# Patient Record
Sex: Female | Born: 1990 | Race: Black or African American | Hispanic: No | Marital: Single | Smoking: Former smoker
Health system: Southern US, Community
[De-identification: ages and names within clinical notes are randomized; demographics above are authoritative.]

## PROBLEM LIST (undated history)

## (undated) ENCOUNTER — Ambulatory Visit (HOSPITAL_COMMUNITY): Payer: Medicaid Other

## (undated) DIAGNOSIS — F32A Depression, unspecified: Secondary | ICD-10-CM

## (undated) DIAGNOSIS — T7840XA Allergy, unspecified, initial encounter: Secondary | ICD-10-CM

## (undated) DIAGNOSIS — N390 Urinary tract infection, site not specified: Secondary | ICD-10-CM

## (undated) DIAGNOSIS — A749 Chlamydial infection, unspecified: Secondary | ICD-10-CM

## (undated) DIAGNOSIS — J45909 Unspecified asthma, uncomplicated: Secondary | ICD-10-CM

## (undated) HISTORY — PX: WISDOM TOOTH EXTRACTION: SHX21

## (undated) HISTORY — DX: Unspecified asthma, uncomplicated: J45.909

## (undated) HISTORY — DX: Allergy, unspecified, initial encounter: T78.40XA

## (undated) HISTORY — DX: Depression, unspecified: F32.A

---
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
## 2011-01-27 ENCOUNTER — Encounter (HOSPITAL_COMMUNITY): Payer: Self-pay

## 2011-01-27 ENCOUNTER — Emergency Department (HOSPITAL_COMMUNITY): Payer: Medicaid Other

## 2011-01-27 ENCOUNTER — Emergency Department (HOSPITAL_COMMUNITY)
Admission: EM | Admit: 2011-01-27 | Discharge: 2011-01-27 | Disposition: A | Discharge status: Home / Self Care | Payer: Medicaid Other | Attending: Emergency Medicine | Admitting: Emergency Medicine

## 2011-01-27 DIAGNOSIS — O99891 Other specified diseases and conditions complicating pregnancy: Secondary | ICD-10-CM | POA: Insufficient documentation

## 2011-01-27 DIAGNOSIS — J069 Acute upper respiratory infection, unspecified: Secondary | ICD-10-CM | POA: Insufficient documentation

## 2011-01-27 DIAGNOSIS — R509 Fever, unspecified: Secondary | ICD-10-CM | POA: Insufficient documentation

## 2011-01-27 DIAGNOSIS — R0682 Tachypnea, not elsewhere classified: Secondary | ICD-10-CM | POA: Insufficient documentation

## 2011-01-27 DIAGNOSIS — R000 Tachycardia, unspecified: Secondary | ICD-10-CM | POA: Insufficient documentation

## 2011-01-27 DIAGNOSIS — R079 Chest pain, unspecified: Secondary | ICD-10-CM | POA: Insufficient documentation

## 2011-01-27 DIAGNOSIS — R0602 Shortness of breath: Secondary | ICD-10-CM | POA: Insufficient documentation

## 2011-01-27 LAB — POCT I-STAT, CHEM 8
Glucose, Bld: 90 mg/dL (ref 70–99)
HCT: 35 % — ABNORMAL LOW (ref 36.0–46.0)
Hemoglobin: 11.9 g/dL — ABNORMAL LOW (ref 12.0–15.0)
Potassium: 3.2 mEq/L — ABNORMAL LOW (ref 3.5–5.1)
Sodium: 138 mEq/L (ref 135–145)
TCO2: 20 mmol/L (ref 0–100)

## 2011-01-27 LAB — D-DIMER, QUANTITATIVE: D-Dimer, Quant: 0.96 ug/mL-FEU — ABNORMAL HIGH (ref 0.00–0.48)

## 2011-01-27 LAB — URINALYSIS, ROUTINE W REFLEX MICROSCOPIC
Glucose, UA: NEGATIVE mg/dL
Ketones, ur: NEGATIVE mg/dL
Nitrite: NEGATIVE
Protein, ur: NEGATIVE mg/dL
Urobilinogen, UA: 1 mg/dL (ref 0.0–1.0)

## 2011-01-27 LAB — CBC
HCT: 34 % — ABNORMAL LOW (ref 36.0–46.0)
MCHC: 35 g/dL (ref 30.0–36.0)
Platelets: 213 10*3/uL (ref 150–400)
RDW: 12.9 % (ref 11.5–15.5)
WBC: 8.6 10*3/uL (ref 4.0–10.5)

## 2011-01-27 LAB — DIFFERENTIAL
Basophils Absolute: 0 10*3/uL (ref 0.0–0.1)
Basophils Relative: 0 % (ref 0–1)
Eosinophils Relative: 2 % (ref 0–5)
Lymphocytes Relative: 13 % (ref 12–46)

## 2011-01-27 LAB — URINE MICROSCOPIC-ADD ON

## 2011-01-27 MED ORDER — IOHEXOL 300 MG/ML  SOLN
125.0000 mL | Freq: Once | INTRAMUSCULAR | Status: AC | PRN
  Administered 2011-01-27: 103 mL via INTRAVENOUS

## 2011-01-27 NOTE — Progress Notes (Signed)
Call received from [facility] at 1545.  Arrived to Room 20 at [facility] at 1600.  Pt resting comfortably in bed.  G1P0.  [facility] 04/10/11 per [place] dating.  GA 29+4.  Pt received PNC from Dr Gaynell Face.  Next OBGYN appt for 1345 on 01/29/11.  Med hx: no significant med hx.  Ob hx:  Pt reports normal US anatomy scan at 24 weeks.  No issues with current pregnancy.  Surgical hx: negative.  NKDA.  NKFA.  No latex allergies.  Meds;  Flintstones multi vit 1 PO daily.  Last dose 01/27/11.    Pt presents with c/o SOB and coughing.  Pt denies any OB complaints.  Pt states no abdominal cramping or tightening.  Pt denies LOF or vaginal bleeding.  Pt states normal fetal movement.  Pt received an albuterol tx prior to [facility] arrival to room.  Pt states albuterol tx relieved her SOB.  VSS.  O2 sat 99% on RA.  Pt's abdomen soft and nontender upon palpation with no evidence of UCs.  No LOF or vaginal bldg noted.  Copious fetal movement noted that is palpable, visible, and audible on EFM.  EFM/toco applied and assessing.

## 2011-01-27 NOTE — Progress Notes (Signed)
Plan of care discussed with pt at this time.  Pt's questions answered in full and to her verbalized understanding.  Pt verbalizes understanding of and agreement with her care plan. Pt instructed to keep her next regularly scheduled OB appointment with Dr Gaynell Face on 01/29/11.  PTL precautions and kick counts reviewed with pt at this time.  Pt encouraged to go to [facility] to be evaluated should she experience any s/s of PTL, abdominal cramping, uterine contractions, vaginal bleeding, any leaking of fluid, or decreased fetal movement.  Pt verbalizes understanding of and agreement with both her dc instructions and her plan of care.

## 2011-01-27 NOTE — Progress Notes (Signed)
Dr Lottie Rater re pt presence, status, OB/med hx, VS, FHTs, UCs, Albuterol admin, OBGYN appointment 01/29/11.  Pt cleared from OB standpoint by dr Gaynell Face at this time.

## 2011-02-22 ENCOUNTER — Inpatient Hospital Stay (HOSPITAL_COMMUNITY)
Admission: AD | Admit: 2011-02-22 | Discharge: 2011-02-23 | Disposition: A | Discharge status: Home / Self Care | Payer: Medicaid Other | Source: Ambulatory Visit | Attending: Obstetrics | Admitting: Obstetrics

## 2011-02-22 DIAGNOSIS — N760 Acute vaginitis: Secondary | ICD-10-CM

## 2011-02-22 DIAGNOSIS — B9689 Other specified bacterial agents as the cause of diseases classified elsewhere: Secondary | ICD-10-CM | POA: Insufficient documentation

## 2011-02-22 DIAGNOSIS — O2390 Unspecified genitourinary tract infection in pregnancy, unspecified trimester: Secondary | ICD-10-CM | POA: Insufficient documentation

## 2011-02-22 DIAGNOSIS — A499 Bacterial infection, unspecified: Secondary | ICD-10-CM

## 2011-02-22 DIAGNOSIS — O209 Hemorrhage in early pregnancy, unspecified: Secondary | ICD-10-CM | POA: Insufficient documentation

## 2011-02-22 DIAGNOSIS — O4690 Antepartum hemorrhage, unspecified, unspecified trimester: Secondary | ICD-10-CM

## 2011-02-22 DIAGNOSIS — Z3480 Encounter for supervision of other normal pregnancy, unspecified trimester: Secondary | ICD-10-CM

## 2011-02-22 HISTORY — DX: Urinary tract infection, site not specified: N39.0

## 2011-02-23 ENCOUNTER — Inpatient Hospital Stay (HOSPITAL_COMMUNITY): Payer: Medicaid Other

## 2011-02-23 ENCOUNTER — Encounter (HOSPITAL_COMMUNITY): Payer: Self-pay

## 2011-02-23 LAB — URINE MICROSCOPIC-ADD ON

## 2011-02-23 LAB — URINALYSIS, ROUTINE W REFLEX MICROSCOPIC
Bilirubin Urine: NEGATIVE
Glucose, UA: NEGATIVE mg/dL
Hgb urine dipstick: NEGATIVE
Ketones, ur: NEGATIVE mg/dL
Protein, ur: NEGATIVE mg/dL
pH: 6 (ref 5.0–8.0)

## 2011-02-23 LAB — WET PREP, GENITAL

## 2011-02-23 MED ORDER — METRONIDAZOLE 500 MG PO TABS: 500.0000 mg | ORAL_TABLET | Freq: Two times a day (BID) | ORAL | Status: DC

## 2011-02-23 MED ORDER — METRONIDAZOLE 500 MG PO TABS: 500.0000 mg | ORAL_TABLET | Freq: Two times a day (BID) | ORAL | Status: AC

## 2011-02-23 NOTE — ED Notes (Signed)
Roney Marion CNM in with pt

## 2011-02-23 NOTE — ED Provider Notes (Signed)
History     Chief Complaint  Patient presents with  . Vaginal Bleeding   HPI Patient is here with c/o bright red vaginal bleeding about 2340pm. She states that she was at work and noticed the bleeding after voiding with wiping.  No active bleeding at this time.  Also reports recent intercourse.  Good fetal movement and denies any pelvic pain.    Past Medical History  Diagnosis Date  . UTI (lower urinary tract infection)     History reviewed. No pertinent past surgical history.  Family History  Problem Relation Age of Onset  . Diabetes Maternal Grandmother     History  Substance Use Topics  . Smoking status: Never Smoker   . Smokeless tobacco: Not on file  . Alcohol Use: No    Allergies: No Known Allergies  Prescriptions prior to admission  Medication Sig Dispense Refill  . albuterol (VENTOLIN HFA) 108 (90 BASE) MCG/ACT inhaler Inhale 2 puffs into the lungs every 6 (six) hours as needed. Shortness of breath       . amoxicillin (AMOXIL) 500 MG capsule Take 500 mg by mouth 3 (three) times daily.        . flintstones complete (FLINTSTONES) 60 MG chewable tablet Chew 1 tablet by mouth daily.          Review of Systems  Genitourinary:       Vaginal bleeding  All other systems reviewed and are negative.   Physical Exam   Blood pressure 131/59, pulse 85, temperature 97 F (36.1 C), temperature source Oral, resp. rate 20, height 5\' 2"  (1.575 m), weight 69.31 kg (152 lb 12.8 oz).  Physical Exam  Constitutional: She is oriented to person, place, and time. She appears well-developed and well-nourished.  HENT:  Head: Normocephalic.  Neck: Normal range of motion. Neck supple.  Cardiovascular: Normal rate, regular rhythm and normal heart sounds.  Exam reveals no gallop and no friction rub.   No murmur heard. Respiratory: Effort normal and breath sounds normal. No respiratory distress.  GI: Soft. She exhibits no mass. There is no tenderness. There is no rebound and no  guarding.  Genitourinary: No bleeding around the vagina. Vaginal discharge (white dc) found.  Neurological: She is alert and oriented to person, place, and time.  Skin: Skin is warm and dry.  FHR 150's, +accel, no decel; Toco - irritability (not felt by pt)  MAU Course  Procedures UA - small leuk Wet prep - moderate clue, neg trich, neg yeast [place] - no evidence of abruption or previa; BPP 6/8 (2 off for breathing)  Assessment and Plan  Bacterial Vaginosis Bleeding During Pregnancy  Plan: RX Flagyl F/U with Dr. Gaynell Face this week. Preterm labor precautions Urine culture [facility] 02/23/2011, 1:03 AM

## 2011-02-23 NOTE — Progress Notes (Signed)
Patient is here with c/o bright red vaginal bleeding about 2340pm. She states that she was at work and noticed the bleeding after voiding. She reports good fetal movement and denies any regular pain.

## 2011-02-23 NOTE — Progress Notes (Signed)
G1 at 33.3wks. Pt didn't feel FM for about past . Went to [facility] and saw bright blood on tissue. Was at work at [employer]. Came straight to hospital.

## 2011-02-24 LAB — URINE CULTURE: Culture  Setup Time: 201210281124

## 2011-03-06 LAB — STREP B DNA PROBE: GBS: NEGATIVE

## 2011-04-10 ENCOUNTER — Inpatient Hospital Stay (HOSPITAL_COMMUNITY): Payer: Medicaid Other | Admitting: Anesthesiology

## 2011-04-10 ENCOUNTER — Encounter (HOSPITAL_COMMUNITY): Payer: Self-pay | Admitting: *Deleted

## 2011-04-10 ENCOUNTER — Encounter (HOSPITAL_COMMUNITY): Payer: Self-pay | Admitting: Anesthesiology

## 2011-04-10 ENCOUNTER — Inpatient Hospital Stay (HOSPITAL_COMMUNITY)
Admission: AD | Admit: 2011-04-10 | Discharge: 2011-04-13 | DRG: 775 | Disposition: A | Discharge status: Home / Self Care | Payer: Medicaid Other | Source: Ambulatory Visit | Attending: Obstetrics | Admitting: Obstetrics

## 2011-04-10 LAB — RPR: RPR Ser Ql: NONREACTIVE

## 2011-04-10 LAB — CBC
Hemoglobin: 12.3 g/dL (ref 12.0–15.0)
Platelets: 235 10*3/uL (ref 150–400)
RBC: 3.99 MIL/uL (ref 3.87–5.11)
WBC: 7.4 10*3/uL (ref 4.0–10.5)

## 2011-04-10 MED ORDER — FENTANYL 2.5 MCG/ML BUPIVACAINE 1/10 % EPIDURAL INFUSION (WH - ANES)
14.0000 mL/h | INTRAMUSCULAR | Status: DC
  Administered 2011-04-10 – 2011-04-11 (×2): 14 mL/h via EPIDURAL
  Filled 2011-04-10 (×2): qty 60

## 2011-04-10 MED ORDER — EPHEDRINE 5 MG/ML INJ: 10.0000 mg | INTRAVENOUS | Status: DC | PRN

## 2011-04-10 MED ORDER — ACETAMINOPHEN 325 MG PO TABS: 650.0000 mg | ORAL_TABLET | ORAL | Status: DC | PRN

## 2011-04-10 MED ORDER — DIPHENHYDRAMINE HCL 50 MG/ML IJ SOLN: 12.5000 mg | INTRAMUSCULAR | Status: DC | PRN

## 2011-04-10 MED ORDER — OXYTOCIN 20 UNITS IN LACTATED RINGERS INFUSION - SIMPLE
1.0000 m[IU]/min | INTRAVENOUS | Status: DC
Start: 2011-04-10 — End: 2011-04-11
  Administered 2011-04-11: 1 m[IU]/min via INTRAVENOUS
  Filled 2011-04-10: qty 1000

## 2011-04-10 MED ORDER — OXYTOCIN 20 UNITS IN LACTATED RINGERS INFUSION - SIMPLE
125.0000 mL/h | Freq: Once | INTRAVENOUS | Status: AC
  Administered 2011-04-11: 999 mL/h via INTRAVENOUS

## 2011-04-10 MED ORDER — TERBUTALINE SULFATE 1 MG/ML IJ SOLN: 0.2500 mg | Freq: Once | INTRAMUSCULAR | Status: AC | PRN

## 2011-04-10 MED ORDER — LIDOCAINE HCL (PF) 1 % IJ SOLN
30.0000 mL | INTRAMUSCULAR | Status: DC | PRN
  Filled 2011-04-10: qty 30

## 2011-04-10 MED ORDER — FLEET ENEMA 7-19 GM/118ML RE ENEM: 1.0000 | ENEMA | RECTAL | Status: DC | PRN

## 2011-04-10 MED ORDER — LIDOCAINE HCL 1.5 % IJ SOLN
INTRAMUSCULAR | Status: DC | PRN
  Administered 2011-04-10 (×2): 5 mL via EPIDURAL

## 2011-04-10 MED ORDER — OXYTOCIN BOLUS FROM INFUSION
500.0000 mL | Freq: Once | INTRAVENOUS | Status: DC
  Filled 2011-04-10: qty 500

## 2011-04-10 MED ORDER — OXYCODONE-ACETAMINOPHEN 5-325 MG PO TABS: 2.0000 | ORAL_TABLET | ORAL | Status: DC | PRN

## 2011-04-10 MED ORDER — PHENYLEPHRINE 40 MCG/ML (10ML) SYRINGE FOR IV PUSH (FOR BLOOD PRESSURE SUPPORT): 80.0000 ug | PREFILLED_SYRINGE | INTRAVENOUS | Status: DC | PRN

## 2011-04-10 MED ORDER — BUTORPHANOL TARTRATE 2 MG/ML IJ SOLN
1.0000 mg | INTRAMUSCULAR | Status: DC | PRN
  Administered 2011-04-10: 1 mg via INTRAVENOUS
  Filled 2011-04-10 (×2): qty 1

## 2011-04-10 MED ORDER — LACTATED RINGERS IV SOLN
500.0000 mL | INTRAVENOUS | Status: DC | PRN
  Administered 2011-04-11: 500 mL via INTRAVENOUS

## 2011-04-10 MED ORDER — LACTATED RINGERS IV SOLN
500.0000 mL | Freq: Once | INTRAVENOUS | Status: AC
  Administered 2011-04-10: 500 mL via INTRAVENOUS

## 2011-04-10 MED ORDER — IBUPROFEN 600 MG PO TABS
600.0000 mg | ORAL_TABLET | Freq: Four times a day (QID) | ORAL | Status: DC | PRN
  Administered 2011-04-11: 600 mg via ORAL

## 2011-04-10 MED ORDER — CITRIC ACID-SODIUM CITRATE 334-500 MG/5ML PO SOLN: 30.0000 mL | ORAL | Status: DC | PRN

## 2011-04-10 MED ORDER — LACTATED RINGERS IV SOLN
INTRAVENOUS | Status: DC
  Administered 2011-04-10 – 2011-04-11 (×3): via INTRAVENOUS
  Administered 2011-04-11: 116 mL/h via INTRAVENOUS

## 2011-04-10 MED ORDER — PROMETHAZINE HCL 25 MG/ML IJ SOLN: 12.5000 mg | INTRAMUSCULAR | Status: DC | PRN

## 2011-04-10 MED ORDER — ONDANSETRON HCL 4 MG/2ML IJ SOLN: 4.0000 mg | Freq: Four times a day (QID) | INTRAMUSCULAR | Status: DC | PRN

## 2011-04-10 MED ORDER — EPHEDRINE 5 MG/ML INJ
10.0000 mg | INTRAVENOUS | Status: DC | PRN
  Filled 2011-04-10: qty 4

## 2011-04-10 MED ORDER — PHENYLEPHRINE 40 MCG/ML (10ML) SYRINGE FOR IV PUSH (FOR BLOOD PRESSURE SUPPORT)
80.0000 ug | PREFILLED_SYRINGE | INTRAVENOUS | Status: DC | PRN
  Filled 2011-04-10: qty 5

## 2011-04-10 NOTE — Anesthesia Preprocedure Evaluation (Signed)

## 2011-04-10 NOTE — Plan of Care (Signed)
Problem: Consults Goal: Orientation to unit: Other (Specify with a note) Outcome: Completed/Met Date Met:  04/10/11 Instructed pt and visitors on visitation policy of  unit

## 2011-04-10 NOTE — Progress Notes (Signed)
Contractions started last night.  Denies bleeding, has mucous d/c

## 2011-04-10 NOTE — Plan of Care (Signed)
Problem: Consults Goal: Birthing Suites Patient Information Press F2 to bring up selections list   Pt 37-[redacted] weeks EGA     

## 2011-04-10 NOTE — Anesthesia Procedure Notes (Signed)
Epidural Patient location during procedure: OB Start time: 04/10/2011 10:37 PM  Staffing Anesthesiologist: Brayton Caves R Performed by: anesthesiologist   Preanesthetic Checklist Completed: patient identified, site marked, surgical consent, pre-op evaluation, timeout performed, IV checked, risks and benefits discussed and monitors and equipment checked  Epidural Patient position: sitting Prep: site prepped and draped and DuraPrep Patient monitoring: continuous pulse ox and blood pressure Approach: midline Injection technique: LOR air and LOR saline  Needle:  Needle type: Tuohy  Needle gauge: 17 G Needle length: 9 cm Needle insertion depth: 5 cm cm Catheter type: closed end flexible Catheter size: 19 Gauge Catheter at skin depth: 10 cm Test dose: negative  Assessment Events: blood not aspirated, injection not painful, no injection resistance, negative IV test and no paresthesia  Additional Notes Patient identified.  Risk benefits discussed including failed block, incomplete pain control, headache, nerve damage, paralysis, blood pressure changes, nausea, vomiting, reactions to medication both toxic or allergic, and postpartum back pain.  Patient expressed understanding and wished to proceed.  All questions were answered.  Sterile technique used throughout procedure and epidural site dressed with sterile barrier dressing. No paresthesia or other complications noted.The patient did not experience any signs of intravascular injection such as tinnitus or metallic taste in mouth nor signs of intrathecal spread such as rapid motor block. Please see nursing notes for vital signs.

## 2011-04-10 NOTE — H&P (Signed)
This is Dr. Francoise Ceo dictating the history and physical on  Diamond Rodriguez she's a 20 year old primigravida the negative GBS for 2 weeks today to 04/10/2011 she desired induction she is on Pitocin and her cervix is 5 cm 100% with the vertex at a -3 station membranes were ruptured artificially and the fluid was slightly meconium-stained past medical history  Past medical history negative Past surgical history negative Social history Negative System review negative Physical exam Well-developed female in no distress HEENT negative Breasts negative Lungs clear Heart regular rhythm no murmurs no gallops Abdomen term Extremities negative and

## 2011-04-11 ENCOUNTER — Encounter (HOSPITAL_COMMUNITY): Payer: Self-pay | Admitting: Pediatric Intensive Care

## 2011-04-11 MED ORDER — DIBUCAINE 1 % RE OINT: 1.0000 "application " | TOPICAL_OINTMENT | RECTAL | Status: DC | PRN

## 2011-04-11 MED ORDER — BENZOCAINE-MENTHOL 20-0.5 % EX AERO
INHALATION_SPRAY | CUTANEOUS | Status: AC
  Filled 2011-04-11: qty 56

## 2011-04-11 MED ORDER — ONDANSETRON HCL 4 MG/2ML IJ SOLN: 4.0000 mg | INTRAMUSCULAR | Status: DC | PRN

## 2011-04-11 MED ORDER — SENNOSIDES-DOCUSATE SODIUM 8.6-50 MG PO TABS
2.0000 | ORAL_TABLET | Freq: Every day | ORAL | Status: DC
  Administered 2011-04-11: 2 via ORAL

## 2011-04-11 MED ORDER — ONDANSETRON HCL 4 MG PO TABS: 4.0000 mg | ORAL_TABLET | ORAL | Status: DC | PRN

## 2011-04-11 MED ORDER — TETANUS-DIPHTH-ACELL PERTUSSIS 5-2.5-18.5 LF-MCG/0.5 IM SUSP: 0.5000 mL | Freq: Once | INTRAMUSCULAR | Status: DC

## 2011-04-11 MED ORDER — OXYCODONE-ACETAMINOPHEN 5-325 MG PO TABS: 1.0000 | ORAL_TABLET | ORAL | Status: DC | PRN

## 2011-04-11 MED ORDER — LANOLIN HYDROUS EX OINT: TOPICAL_OINTMENT | CUTANEOUS | Status: DC | PRN

## 2011-04-11 MED ORDER — IBUPROFEN 600 MG PO TABS
600.0000 mg | ORAL_TABLET | Freq: Four times a day (QID) | ORAL | Status: DC
  Administered 2011-04-11 – 2011-04-13 (×8): 600 mg via ORAL
  Filled 2011-04-11 (×9): qty 1

## 2011-04-11 MED ORDER — FERROUS SULFATE 325 (65 FE) MG PO TABS
325.0000 mg | ORAL_TABLET | Freq: Two times a day (BID) | ORAL | Status: DC
  Administered 2011-04-11 – 2011-04-13 (×5): 325 mg via ORAL
  Filled 2011-04-11 (×5): qty 1

## 2011-04-11 MED ORDER — SIMETHICONE 80 MG PO CHEW: 80.0000 mg | CHEWABLE_TABLET | ORAL | Status: DC | PRN

## 2011-04-11 MED ORDER — WITCH HAZEL-GLYCERIN EX PADS: 1.0000 "application " | MEDICATED_PAD | CUTANEOUS | Status: DC | PRN

## 2011-04-11 MED ORDER — PRENATAL PLUS 27-1 MG PO TABS
1.0000 | ORAL_TABLET | Freq: Every day | ORAL | Status: DC
  Administered 2011-04-11 – 2011-04-13 (×3): 1 via ORAL
  Filled 2011-04-11 (×3): qty 1

## 2011-04-11 MED ORDER — BENZOCAINE-MENTHOL 20-0.5 % EX AERO
1.0000 "application " | INHALATION_SPRAY | CUTANEOUS | Status: DC | PRN
  Administered 2011-04-11 – 2011-04-12 (×2): 1 via TOPICAL

## 2011-04-11 MED ORDER — DIPHENHYDRAMINE HCL 25 MG PO CAPS: 25.0000 mg | ORAL_CAPSULE | Freq: Four times a day (QID) | ORAL | Status: DC | PRN

## 2011-04-11 MED ORDER — ZOLPIDEM TARTRATE 5 MG PO TABS: 5.0000 mg | ORAL_TABLET | Freq: Every evening | ORAL | Status: DC | PRN

## 2011-04-11 NOTE — Progress Notes (Signed)
UR chart review completed.  

## 2011-04-11 NOTE — Anesthesia Postprocedure Evaluation (Signed)
  Anesthesia Post Note  Patient: Diamond Rodriguez  Procedure(s) Performed: * No procedures listed *  Anesthesia type: Epidural  Patient location: Mother/Baby  Post pain: Pain level controlled  Post assessment: Post-op Vital signs reviewed  Last Vitals:  Filed Vitals:   04/11/11 0645  BP: 115/68  Pulse: 79  Temp: 36.8 C  Resp: 18    Post vital signs: Reviewed  Level of consciousness:alert  Complications: No apparent anesthesia complications

## 2011-04-11 NOTE — Progress Notes (Signed)
Pt delivered viable female with APGARS 9,9 SVD. Dr Gaynell Face present.

## 2011-04-11 NOTE — Progress Notes (Signed)
MD made aware of pt status: SVE, uterine contraction pattern, FHT tracing, interventions and Pitocin settings. Continue with current POC. Will continue to monitor.

## 2011-04-12 LAB — CBC
MCH: 30.2 pg (ref 26.0–34.0)
MCHC: 34 g/dL (ref 30.0–36.0)
Platelets: 214 10*3/uL (ref 150–400)
RDW: 13.4 % (ref 11.5–15.5)

## 2011-04-12 MED ORDER — BENZOCAINE-MENTHOL 20-0.5 % EX AERO
INHALATION_SPRAY | CUTANEOUS | Status: AC
  Administered 2011-04-12: 1 via TOPICAL
  Filled 2011-04-12: qty 56

## 2011-04-12 NOTE — Progress Notes (Signed)
Patient ID: Diamond Rodriguez, female   DOB: June 25, 1990, 20 y.o.   MRN: [id number] Post Partum Day 1 S/P spontaneous vaginal RH status/Rubella reviewed.  Feeding: breast Subjective: No HA, SOB, CP, F/C, breast symptoms. Normal vaginal bleeding, no clots.     Objective: BP 103/72  Pulse 87  Temp(Src) 97.8 F (36.6 C) (Oral)  Resp 18  Ht 5\' 2"  (1.575 m)  Wt 72.485 kg (159 lb 12.8 oz)  BMI 29.23 kg/m2  SpO2 100%  Breastfeeding? Unknown   Physical Exam:  General: alert Lochia: appropriate Uterine Fundus: firm DVT Evaluation: No evidence of DVT seen on physical exam. Ext: No c/c/e  Basename 04/12/11 0835 04/10/11 1250  HGB 11.1* 12.3  HCT 32.6* 35.4*      Assessment/Plan: 20 y.o.  PPD #1 .  normal postpartum exam Continue current postpartum care  Ambulate   LOS: 2 days   [name],Melisssa Donner A 04/12/2011, 11:13 AM

## 2011-04-13 MED ORDER — IBUPROFEN 600 MG PO TABS: 600.0000 mg | ORAL_TABLET | Freq: Four times a day (QID) | ORAL | Status: AC

## 2011-04-13 MED ORDER — ALBUTEROL SULFATE HFA 108 (90 BASE) MCG/ACT IN AERS: 2.0000 | INHALATION_SPRAY | Freq: Four times a day (QID) | RESPIRATORY_TRACT | Status: DC | PRN

## 2011-04-13 MED ORDER — OXYCODONE-ACETAMINOPHEN 5-325 MG PO TABS: 1.0000 | ORAL_TABLET | Freq: Four times a day (QID) | ORAL | Status: AC | PRN

## 2011-04-13 NOTE — Progress Notes (Signed)
Post Partum Day #2 S/P:spontaneous vaginal  RH status/Rubella reviewed.  Feeding: breast Subjective: No HA, SOB, CP, F/C, breast symptoms: no. Normal vaginal bleeding, no clots.     Objective:  Blood pressure 131/86, pulse 69, temperature 98 F (36.7 C), temperature source Oral, resp. rate 18, height 5\' 2"  (1.575 m), weight 72.485 kg (159 lb 12.8 oz), SpO2 100.00%, unknown if currently breastfeeding.   Physical Exam:  General: alert Lochia: appropriate Uterine Fundus: firm DVT Evaluation: No evidence of DVT seen on physical exam. Ext: No c/c/e  Basename 04/12/11 0835 04/10/11 1250  HGB 11.1* 12.3  HCT 32.6* 35.4*    Assessment/Plan: 20 y.o.  PPD # 2 .  normal postpartum exam Undecided ZO:[id number] method Continue current postpartum care D/C home   LOS: 3 days   [name],Yolande Skoda A 04/13/2011, 11:36 AM

## 2011-04-13 NOTE — Discharge Summary (Signed)
  Obstetric Discharge Summary Reason for Admission: onset of labor Prenatal Procedures: none Intrapartum Procedures: spontaneous vaginal delivery Postpartum Procedures: none Complications-Operative and Postpartum: none  Hemoglobin  Date Value Range Status  04/12/2011 11.1* 12.0-15.0 (g/dL) Final     HCT  Date Value Range Status  04/12/2011 32.6* 36.0-46.0 (%) Final    Discharge Diagnoses: Term Pregnancy-delivered  Discharge Information: Date: 04/13/2011 Activity: pelvic rest Diet: routine Medications:  Prior to Admission medications   Medication Sig Start Date End Date Taking? Authorizing Provider  albuterol (VENTOLIN HFA) 108 (90 BASE) MCG/ACT inhaler Inhale 2 puffs into the lungs every 6 (six) hours as needed. Shortness of breath 04/13/11   Roseanna Rainbow, MD  ibuprofen (ADVIL,MOTRIN) 600 MG tablet Take 1 tablet (600 mg total) by mouth every 6 (six) hours. 04/13/11 04/23/11  Roseanna Rainbow, MD  oxyCODONE-acetaminophen (PERCOCET) 5-325 MG per tablet Take 1-2 tablets by mouth every 6 (six) hours as needed (moderate - severe pain). 04/13/11 04/23/11  Roseanna Rainbow, MD    Condition: stable Instructions: refer to routine discharge instructions Discharge to: home Follow-up Information    Follow up with MARSHALL,BERNARD A, MD. Make an appointment in 2 weeks.   Contact information:   [address] [place] [address] [phone]          Newborn Data: Live born  Information for the patient's newborn:  Diamond, Wilz Girl Rodriguez [[id number]]  female ; APGAR , ; weight ;  Home with mother.  [name],[name] 04/13/2011, 11:42 AM

## 2011-07-07 ENCOUNTER — Encounter (HOSPITAL_COMMUNITY): Payer: Self-pay

## 2011-07-07 ENCOUNTER — Emergency Department (HOSPITAL_COMMUNITY)
Admission: EM | Admit: 2011-07-07 | Discharge: 2011-07-07 | Disposition: A | Discharge status: Home / Self Care | Payer: Medicaid Other | Attending: Emergency Medicine | Admitting: Emergency Medicine

## 2011-07-07 DIAGNOSIS — N12 Tubulo-interstitial nephritis, not specified as acute or chronic: Secondary | ICD-10-CM | POA: Insufficient documentation

## 2011-07-07 DIAGNOSIS — E876 Hypokalemia: Secondary | ICD-10-CM | POA: Insufficient documentation

## 2011-07-07 DIAGNOSIS — R000 Tachycardia, unspecified: Secondary | ICD-10-CM | POA: Insufficient documentation

## 2011-07-07 LAB — URINE MICROSCOPIC-ADD ON

## 2011-07-07 LAB — URINALYSIS, ROUTINE W REFLEX MICROSCOPIC
Glucose, UA: NEGATIVE mg/dL
Ketones, ur: NEGATIVE mg/dL
Specific Gravity, Urine: 1.022 (ref 1.005–1.030)
pH: 6 (ref 5.0–8.0)

## 2011-07-07 LAB — COMPREHENSIVE METABOLIC PANEL
ALT: 12 U/L (ref 0–35)
AST: 16 U/L (ref 0–37)
Alkaline Phosphatase: 96 U/L (ref 39–117)
Calcium: 9.7 mg/dL (ref 8.4–10.5)
GFR calc Af Amer: >90 mL/min (ref >90)
Glucose, Bld: 94 mg/dL (ref 70–99)
Potassium: 2.9 mEq/L — ABNORMAL LOW (ref 3.5–5.1)
Sodium: 137 mEq/L (ref 135–145)
Total Protein: 8.1 g/dL (ref 6.0–8.3)

## 2011-07-07 LAB — CBC
MCH: 29.6 pg (ref 26.0–34.0)
Platelets: 317 10*3/uL (ref 150–400)
RBC: 3.78 MIL/uL — ABNORMAL LOW (ref 3.87–5.11)
RDW: 12.2 % (ref 11.5–15.5)
WBC: 12 10*3/uL — ABNORMAL HIGH (ref 4.0–10.5)

## 2011-07-07 LAB — DIFFERENTIAL
Basophils Absolute: 0 10*3/uL (ref 0.0–0.1)
Eosinophils Absolute: 0.1 10*3/uL (ref 0.0–0.7)
Eosinophils Relative: 1 % (ref 0–5)
Lymphocytes Relative: 14 % (ref 12–46)
Lymphs Abs: 1.6 10*3/uL (ref 0.7–4.0)
Neutrophils Relative %: 72 % (ref 43–77)

## 2011-07-07 MED ORDER — ACETAMINOPHEN 325 MG PO TABS
650.0000 mg | ORAL_TABLET | Freq: Once | ORAL | Status: AC
  Administered 2011-07-07: 650 mg via ORAL
  Filled 2011-07-07: qty 2

## 2011-07-07 MED ORDER — HYDROCODONE-ACETAMINOPHEN 5-500 MG PO TABS: 1.0000 | ORAL_TABLET | Freq: Four times a day (QID) | ORAL | Status: AC | PRN

## 2011-07-07 MED ORDER — SODIUM CHLORIDE 0.9 % IV BOLUS (SEPSIS)
1000.0000 mL | Freq: Once | INTRAVENOUS | Status: AC
  Administered 2011-07-07: 1000 mL via INTRAVENOUS

## 2011-07-07 MED ORDER — DEXTROSE 5 % IV SOLN
1.0000 g | Freq: Once | INTRAVENOUS | Status: AC
  Administered 2011-07-07: 1 g via INTRAVENOUS
  Filled 2011-07-07: qty 10

## 2011-07-07 MED ORDER — POTASSIUM CHLORIDE CRYS ER 20 MEQ PO TBCR
40.0000 meq | EXTENDED_RELEASE_TABLET | Freq: Once | ORAL | Status: AC
  Administered 2011-07-07: 40 meq via ORAL
  Filled 2011-07-07: qty 2

## 2011-07-07 MED ORDER — POTASSIUM CHLORIDE CRYS ER 20 MEQ PO TBCR
40.0000 meq | EXTENDED_RELEASE_TABLET | Freq: Once | ORAL | Status: AC
  Administered 2011-07-07: 40 meq via ORAL

## 2011-07-07 MED ORDER — CEFPODOXIME PROXETIL 200 MG PO TABS: 200.0000 mg | ORAL_TABLET | Freq: Two times a day (BID) | ORAL | Status: AC

## 2011-07-07 MED ORDER — IBUPROFEN 800 MG PO TABS
800.0000 mg | ORAL_TABLET | Freq: Once | ORAL | Status: AC
  Administered 2011-07-07: 800 mg via ORAL
  Filled 2011-07-07: qty 1

## 2011-07-07 MED ORDER — POTASSIUM CHLORIDE CRYS ER 20 MEQ PO TBCR: 20.0000 meq | EXTENDED_RELEASE_TABLET | Freq: Every day | ORAL | Status: DC

## 2011-07-07 NOTE — Discharge Instructions (Signed)
Hypokalemia Hypokalemia means a low potassium level in the blood.Potassium is an electrolyte that helps regulate the amount of fluid in the body. It also stimulates muscle contraction and maintains a stable acid-base balance.Most of the body's potassium is inside of cells, and only a very small amount is in the blood. Because the amount in the blood is so small, minor changes can have big effects. PREPARATION FOR TEST Testing for potassium requires taking a blood sample taken by needle from a vein in the arm. The skin is cleaned thoroughly before the sample is drawn. There is no other special preparation needed. NORMAL VALUES Potassium levels below 3.5 mEq/L are abnormally low. Levels above 5.1 mEq/L are abnormally high. Ranges for normal findings may vary among different laboratories and hospitals. You should always check with your doctor after having lab work or other tests done to discuss the meaning of your test results and whether your values are considered within normal limits. MEANING OF TEST  Your caregiver will go over the test results with you and discuss the importance and meaning of your results, as well as treatment options and the need for additional tests, if necessary. A potassium level is frequently part of a routine medical exam. It is usually included as part of a whole "panel" of tests for several blood salts (such as Sodium and Chloride). It may be done as part of follow-up when a low potassium level was found in the past or other blood salts are suspected of being out of balance. A low potassium level might be suspected if you have one or more of the following:  Symptoms of weakness.   Abnormal heart rhythms.   High blood pressure and are taking medication to control this, especially water pills (diuretics).   Kidney disease that can affect your potassium level .   Diabetes requiring the use of insulin. The potassium may fall after taking insulin, especially if the  diabetes had been out of control for a while.   A condition requiring the use of cortisone-type medication or certain types of antibiotics.   Vomiting and/or diarrhea for more than a day or two.   A stomach or intestinal condition that may not permit appropriate absorption of potassium.   Fainting episodes.   Mental confusion.  OBTAINING TEST RESULTS It is your responsibility to obtain your test results. Ask the lab or department performing the test when and how you will get your results.  Please contact your caregiver directly if you have not received the results within one week. At that time, ask if there is anything different or new you should be doing in relation to the results. TREATMENT Hypokalemia can be treated with potassium supplements taken by mouth and/or adjustments in your current medications. A diet high in potassium is also helpful. Foods with high potassium content are:  Peas, lentils, lima beans, nuts, and dried fruit.   Whole grain and bran cereals and breads.   Fresh fruit, vegetables (bananas, cantaloupe, grapefruit, oranges, tomatoes, honeydew melons, potatoes).   Orange and tomato juices.   Meats. If potassium supplement has been prescribed for you today or your medications have been adjusted, see your personal caregiver in time02 for a re-check.  SEEK MEDICAL CARE IF:  There is a feeling of worsening weakness.   You experience repeated chest palpitations.   You are diabetic and having difficulty keeping your blood sugars in the normal range.   You are experiencing vomiting and/or diarrhea.   You are having  difficulty with any of your regular medications.  SEEK IMMEDIATE MEDICAL CARE IF:  You experience chest pain, shortness of breath, or episodes of dizziness.   You have been having vomiting or diarrhea for more than 2 days.   You have a fainting episode.  MAKE SURE YOU:   Understand these instructions.   Will watch your condition.   Will  get help right away if you are not doing well or get worse.  Document Released: 04/14/2005 Document Revised: 04/03/2011 Document Reviewed: 03/25/2008 [facility] Patient Information 2012 [place], [place].Pyelonephritis, Adult Pyelonephritis is a kidney infection. In general, there are 2 main types of pyelonephritis:  Infections that come on quickly without any warning (acute pyelonephritis).   Infections that persist for a long period of time (chronic pyelonephritis).  CAUSES  Two main causes of pyelonephritis are:  Bacteria traveling from the bladder to the kidney. This is a problem especially in pregnant women. The urine in the bladder can become filled with bacteria from multiple causes, including:   Inflammation of the prostate gland (prostatitis).   Sexual intercourse in females.   Bladder infection (cystitis).   Bacteria traveling from the bloodstream to the tissue part of the kidney.  Problems that may increase your risk of getting a kidney infection include:  Diabetes.   Kidney stones or bladder stones.   Cancer.   Catheters placed in the bladder.   Other abnormalities of the kidney or ureter.  SYMPTOMS   Abdominal pain.   Pain in the side or flank area.   Fever.   Chills.   Upset stomach.   Blood in the urine (dark urine).   Frequent urination.   Strong or persistent urge to urinate.   Burning or stinging when urinating.  DIAGNOSIS  Your caregiver may diagnose your kidney infection based on your symptoms. A urine sample may also be taken. TREATMENT  In general, treatment depends on how severe the infection is.   If the infection is mild and caught early, your caregiver may treat you with oral antibiotics and send you home.   If the infection is more severe, the bacteria may have gotten into the bloodstream. This will require intravenous (IV) antibiotics and a hospital stay. Symptoms may include:   High fever.   Severe flank pain.   Shaking chills.     Even after a hospital stay, your caregiver may require you to be on oral antibiotics for a period of time.   Other treatments may be required depending upon the cause of the infection.  HOME CARE INSTRUCTIONS   Take your antibiotics as directed. Finish them even if you start to feel better.   Make an appointment to have your urine checked to make sure the infection is gone.   Drink enough fluids to keep your urine clear or pale yellow.   Take medicines for the bladder if you have urgency and frequency of urination as directed by your caregiver.  SEEK IMMEDIATE MEDICAL CARE IF:   You have a fever.   You are unable to take your antibiotics or fluids.   You develop shaking chills.   You experience extreme weakness or fainting.   There is no improvement after 2 days of treatment.  MAKE SURE YOU:  Understand these instructions.   Will watch your condition.   Will get help right away if you are not doing well or get worse.  Document Released: 04/14/2005 Document Revised: 04/03/2011 Document Reviewed: 09/18/2010 [facility] Patient Information 2012 [place], [place].

## 2011-07-07 NOTE — ED Provider Notes (Signed)
History     CSN: [id number]  Arrival date & time 07/07/11  1439   First MD Initiated Contact with Patient 07/07/11 1833      Chief Complaint  Patient presents with  . Back Pain    (Consider location/radiation/quality/duration/timing/severity/associated sxs/prior treatment) Patient is a 21 y.o. female presenting with back pain. The history is provided by the patient.  Back Pain  This is a new problem. The current episode started more than 2 days ago (About 4 days ago.). The problem occurs constantly. The problem has not changed since onset.The pain is associated with no known injury. Pain location: Right flank. The quality of the pain is described as aching. Radiates to: Radiates around to right lower abdomen. The pain is moderate. Exacerbated by: Nothing. The pain is the same all the time. Associated symptoms include a fever (Fever up to 103.) and abdominal pain. Pertinent negatives include no dysuria, no paresthesias and no weakness. Associated symptoms comments: Patient endorses dark urine but denies difficulty or pain with urination.. She has tried nothing for the symptoms.    Past Medical History  Diagnosis Date  . UTI (lower urinary tract infection)   . No pertinent past medical history     Past Surgical History  Procedure Date  . No past surgeries     Family History  Problem Relation Age of Onset  . Diabetes Maternal Grandmother     History  Substance Use Topics  . Smoking status: Never Smoker   . Smokeless tobacco: Not on file  . Alcohol Use: No    OB History    Grav Para Term Preterm Abortions TAB SAB Ect Mult Living   1 1 1       1       Review of Systems  Constitutional: Positive for fever (Fever up to 103.).  Gastrointestinal: Positive for abdominal pain. Negative for nausea, vomiting and diarrhea.  Genitourinary: Positive for flank pain, decreased urine volume and vaginal discharge (patient reports vaginal discharge since giving birth in December.).  Negative for dysuria, urgency and difficulty urinating.  Musculoskeletal: Positive for back pain.  Neurological: Positive for light-headedness (The patient endorses lightheadedness the onset of symptoms 4 days ago. This has resolved.). Negative for syncope, weakness and paresthesias.  All other systems reviewed and are negative.    Allergies  Review of patient's allergies indicates no known allergies.  Home Medications   Current Outpatient Rx  Name Route Sig Dispense Refill  . IBUPROFEN 200 MG PO TABS Oral Take 600 mg by mouth every 6 (six) hours as needed. Pain/fever      BP 125/68  Pulse 110  Temp(Src) 102.1 F (38.9 C) (Oral)  Resp 20  Ht 5\' 6"  (1.676 m)  Wt 130 lb (58.968 kg)  BMI 20.98 kg/m2  SpO2 100%  LMP 06/30/2011  Breastfeeding? Yes  Physical Exam  Nursing note and vitals reviewed. Constitutional: She is oriented to person, place, and time. She appears well-developed and well-nourished. No distress.  HENT:  Head: Normocephalic and atraumatic.  Mouth/Throat: Oropharynx is clear and moist.  Eyes: EOM are normal. Pupils are equal, round, and reactive to light.  Neck: Normal range of motion. Neck supple.  Cardiovascular: Regular rhythm and normal heart sounds.  Exam reveals no friction rub.   No murmur heard.      Tachycardic.  Pulmonary/Chest: Effort normal and breath sounds normal. No respiratory distress. She has no wheezes. She has no rales.  Abdominal: Soft. There is tenderness (Mild tenderness over right  mid abdomen.). There is no rebound and no guarding.  Musculoskeletal: Normal range of motion. She exhibits tenderness (Right flank tenderness.). She exhibits no edema.  Lymphadenopathy:    She has no cervical adenopathy.  Neurological: She is alert and oriented to person, place, and time.  Skin: Skin is warm and dry. No rash noted.  Psychiatric: She has a normal mood and affect. Her behavior is normal.    ED Course  Procedures (including critical care  time)  Results for orders placed during the hospital encounter of 07/07/11  URINALYSIS, ROUTINE W REFLEX MICROSCOPIC      Component Value Range   Color, Urine YELLOW  YELLOW    APPearance TURBID (*) CLEAR    Specific Gravity, Urine 1.022  1.005 - 1.030    pH 6.0  5.0 - 8.0    Glucose, UA NEGATIVE  NEGATIVE (mg/dL)   Hgb urine dipstick SMALL (*) NEGATIVE    Bilirubin Urine SMALL (*) NEGATIVE    Ketones, ur NEGATIVE  NEGATIVE (mg/dL)   Protein, ur 696 (*) NEGATIVE (mg/dL)   Urobilinogen, UA 4.0 (*) 0.0 - 1.0 (mg/dL)   Nitrite NEGATIVE  NEGATIVE    Leukocytes, UA LARGE (*) NEGATIVE   POCT PREGNANCY, URINE      Component Value Range   Preg Test, Ur NEGATIVE  NEGATIVE   URINE MICROSCOPIC-ADD ON      Component Value Range   Squamous Epithelial / LPF FEW (*) RARE    WBC, UA TOO NUMEROUS TO COUNT  <3 (WBC/hpf)   RBC / HPF 3-6  <3 (RBC/hpf)   Bacteria, UA FEW (*) RARE   CBC      Component Value Range   WBC 12.0 (*) 4.0 - 10.5 (K/uL)   RBC 3.78 (*) 3.87 - 5.11 (MIL/uL)   Hemoglobin 11.2 (*) 12.0 - 15.0 (g/dL)   HCT 29.5 (*) 28.4 - 46.0 (%)   MCV 84.9  78.0 - 100.0 (fL)   MCH 29.6  26.0 - 34.0 (pg)   MCHC 34.9  30.0 - 36.0 (g/dL)   RDW 13.2  44.0 - 10.2 (%)   Platelets 317  150 - 400 (K/uL)  DIFFERENTIAL      Component Value Range   Neutrophils Relative 72  43 - 77 (%)   Neutro Abs 8.6 (*) 1.7 - 7.7 (K/uL)   Lymphocytes Relative 14  12 - 46 (%)   Lymphs Abs 1.6  0.7 - 4.0 (K/uL)   Monocytes Relative 13 (*) 3 - 12 (%)   Monocytes Absolute 1.6 (*) 0.1 - 1.0 (K/uL)   Eosinophils Relative 1  0 - 5 (%)   Eosinophils Absolute 0.1  0.0 - 0.7 (K/uL)   Basophils Relative 0  0 - 1 (%)   Basophils Absolute 0.0  0.0 - 0.1 (K/uL)  COMPREHENSIVE METABOLIC PANEL      Component Value Range   Sodium 137  135 - 145 (mEq/L)   Potassium 2.9 (*) 3.5 - 5.1 (mEq/L)   Chloride 99  96 - 112 (mEq/L)   CO2 25  19 - 32 (mEq/L)   Glucose, Bld 94  70 - 99 (mg/dL)   BUN 8  6 - 23 (mg/dL)   Creatinine,  Ser 7.25  0.50 - 1.10 (mg/dL)   Calcium 9.7  8.4 - 36.6 (mg/dL)   Total Protein 8.1  6.0 - 8.3 (g/dL)   Albumin 3.6  3.5 - 5.2 (g/dL)   AST 16  0 - 37 (U/L)   ALT 12  0 - 35 (U/L)   Alkaline Phosphatase 96  39 - 117 (U/L)   Total Bilirubin 0.5  0.3 - 1.2 (mg/dL)   GFR calc non Af Amer >90  >90 (mL/min)   GFR calc Af Amer >90  >90 (mL/min)       1. Pyelonephritis   2. Hypokalemia       MDM  42:62 PM 21 year old female presenting with a four-day history of lightheadedness, weakness, fatigue, right flank pain and fever up to 103. She states her urine has been darker but otherwise denies any dysuria. She's had no nausea or vomiting. She endorses a history of UTIs but denies any history of pyelonephritis. She has been eating less and drinking less than normal but not vomiting. Patient is in no acute distress. She was febrile to 103.2 and tachycardic to the 120s initially. Her blood pressure is stable. She does have right flank tenderness. Urinalysis is consistent with pyelonephritis. She has a mild leukocytosis but labs are otherwise without significant abnormality. Will treat with Rocephin. Will hydrate with fluids, give Tylenol and reassess.  9:39 PM patient states that she is feeling better with fluids and Tylenol. She's been treated with Rocephin. She is tolerating by mouth well. She has had no vomiting or diarrhea. Fever was improved with Tylenol but is starting to come up again. Will give Motrin. Given that she is young otherwise healthy with no vomiting and tolerating by mouth well, she is stable for outpatient treatment. Will treat with Vantin. She will followup with her primary care physician for reevaluation of her pyelonephritis. Patient was also given 40 mEq of potassium for hypokalemia. Will also discharge her with a prescription for 5 days of potassium supplementation with plans to have this rechecked with her primary physician. This was discussed with the patient and she is  understanding. She was given strong return precautions and discharged home in stable condition.       Sheran Luz, MD 07/08/11 [phone]

## 2011-07-07 NOTE — ED Notes (Signed)
Pt report received from  Jim Falls, RN and care assumed.

## 2011-07-07 NOTE — ED Notes (Signed)
Lower back pain with urgency, also having fever and chills

## 2011-07-07 NOTE — ED Notes (Signed)
Pt states that she has rt lower abd pain and back pain since Sunday had some diarrhea on sat and sun. States her ua is darker not drinking a lot and  She is also still breast feeding her baby  Has had h/a and chills hurts to touch rt back area

## 2011-07-07 NOTE — ED Notes (Signed)
Patient given discharge paperwork; went over discharge instructions with patient.  Instructed patient to follow up with primary care physician (or to call reference number provided to obtain PCP), to take prescriptions as directed, to finish antibiotic prescription completely, to not drive/operate heavy machinery while taking Vicodin, and to return to the ED for new, worsening, or concerning symptoms.

## 2011-07-08 NOTE — ED Provider Notes (Signed)
I  reviewed the resident's note and I agree with the findings and plan.   Celene Kras, MD 07/08/11 [phone]

## 2011-12-27 ENCOUNTER — Emergency Department (INDEPENDENT_AMBULATORY_CARE_PROVIDER_SITE_OTHER): Payer: Worker's Compensation

## 2011-12-27 ENCOUNTER — Emergency Department (INDEPENDENT_AMBULATORY_CARE_PROVIDER_SITE_OTHER)
Admission: EM | Admit: 2011-12-27 | Discharge: 2011-12-27 | Disposition: A | Discharge status: Home / Self Care | Payer: Worker's Compensation | Source: Home / Self Care | Attending: Emergency Medicine | Admitting: Emergency Medicine

## 2011-12-27 ENCOUNTER — Encounter (HOSPITAL_COMMUNITY): Payer: Self-pay

## 2011-12-27 DIAGNOSIS — Z23 Encounter for immunization: Secondary | ICD-10-CM

## 2011-12-27 DIAGNOSIS — S91309A Unspecified open wound, unspecified foot, initial encounter: Secondary | ICD-10-CM

## 2011-12-27 DIAGNOSIS — S91331A Puncture wound without foreign body, right foot, initial encounter: Secondary | ICD-10-CM

## 2011-12-27 MED ORDER — NAPROXEN 500 MG PO TABS: 500.0000 mg | ORAL_TABLET | Freq: Two times a day (BID) | ORAL | Status: AC

## 2011-12-27 MED ORDER — TETANUS-DIPHTH-ACELL PERTUSSIS 5-2.5-18.5 LF-MCG/0.5 IM SUSP
0.5000 mL | Freq: Once | INTRAMUSCULAR | Status: AC
  Administered 2011-12-27: 0.5 mL via INTRAMUSCULAR

## 2011-12-27 MED ORDER — TETANUS-DIPHTH-ACELL PERTUSSIS 5-2.5-18.5 LF-MCG/0.5 IM SUSP
INTRAMUSCULAR | Status: AC
  Filled 2011-12-27: qty 0.5

## 2011-12-27 MED ORDER — CHLORHEXIDINE GLUCONATE 4 % EX LIQD: 60.0000 mL | Freq: Every day | CUTANEOUS | Status: AC | PRN

## 2011-12-27 NOTE — ED Notes (Signed)
Pt states she stepped on a sensory pin at work and has puncture wound to rt foot on thurs.

## 2011-12-27 NOTE — ED Provider Notes (Signed)
History     CSN: [id number]  Arrival date & time 12/27/11  1150   First MD Initiated Contact with Patient 12/27/11 1325      Chief Complaint  Patient presents with  . Foot Injury    stepped on sensory pin    (Consider location/radiation/quality/duration/timing/severity/associated sxs/prior treatment) HPI Comments: Pt state she stepped on the pin part that holds anti-theft device to clothing 2 days ago. Mild bleeding from area, now resolved. C/o localised pain when walking.  no N/V, fevers, erythema streaking up foot. Not a diabetic or smoker.   Patient is a 21 y.o. female presenting with foot injury. The history is provided by the patient. No language interpreter was used.  Foot Injury  The incident occurred 2 days ago. The incident occurred at work. The pain is present in the right foot. The quality of the pain is described as throbbing. Pertinent negatives include no numbness, no inability to bear weight, no loss of motion, no muscle weakness, no loss of sensation and no tingling. She reports no foreign bodies present. The symptoms are aggravated by bearing weight and palpation. She has tried nothing for the symptoms. The treatment provided no relief.    Past Medical History  Diagnosis Date  . UTI (lower urinary tract infection)   . No pertinent past medical history     Past Surgical History  Procedure Date  . No past surgeries     Family History  Problem Relation Age of Onset  . Diabetes Maternal Grandmother     History  Substance Use Topics  . Smoking status: Never Smoker   . Smokeless tobacco: Not on file  . Alcohol Use: No    OB History    Grav Para Term Preterm Abortions TAB SAB Ect Mult Living   1 1 1       1       Review of Systems  Neurological: Negative for tingling and numbness.    Allergies  Review of patient's allergies indicates no known allergies.  Home Medications   Current Outpatient Rx  Name Route Sig Dispense Refill  . CHLORHEXIDINE  GLUCONATE 4 % EX LIQD Topical Apply 60 mLs (4 application total) topically daily as needed. Use daily when bathing for 1-2 weeks 120 mL 0  . NAPROXEN 500 MG PO TABS Oral Take 1 tablet (500 mg total) by mouth 2 (two) times daily. 20 tablet 0    BP 107/65  Pulse 59  Temp 98.6 F (37 C) (Oral)  Resp 16  SpO2 100%  LMP 08/27/2011  Physical Exam  Nursing note and vitals reviewed. Constitutional: She is oriented to person, place, and time. She appears well-developed and well-nourished. No distress.  HENT:  Head: Normocephalic and atraumatic.  Eyes: Conjunctivae and EOM are normal.  Neck: Normal range of motion.  Cardiovascular: Normal rate.   Pulmonary/Chest: Effort normal.  Abdominal: She exhibits no distension.  Musculoskeletal: Normal range of motion.       Healed mildly tender punture wound to plantar aspect of foot. No noted swelling,  erythema, increased temperature, expressible drainage. Able to move all toes, sensation grossly intact.   Neurological: She is alert and oriented to person, place, and time. Coordination normal.  Skin: Skin is warm and dry.  Psychiatric: She has a normal mood and affect. Her behavior is normal. Judgment and thought content normal.    ED Course  Procedures (including critical care time)   Labs Reviewed  POCT PREGNANCY, URINE   Dg Foot Complete  Right  12/27/2011  *RADIOLOGY REPORT*  Clinical Data: Puncture wound to the right foot 2 days ago, stepped on a pin.  Wound is on the plantar surface between the first and second MTP joints.  RIGHT FOOT COMPLETE - 3+ VIEW  Comparison: None.  Findings: No opaque foreign body in the soft tissues.  No evidence of acute or subacute fracture or dislocation.  Well-preserved joint spaces.  Well-preserved bone mineral density.  No intrinsic osseous abnormalities.  IMPRESSION: Normal examination.  No opaque foreign body in the soft tissues.   Original Report Authenticated By: Arnell Sieving, M.D.      1.  Puncture wound of right foot      MDM  Imaging reviewed by myself. No FB. Report per radiologist.  No signs of infection, retained FB. Skin appears to be healing well. approprately tender. Naprysn, local wound care, pt to wear socks until fully healed.  discussed sx/sn that should prompt return to dept . Patient agrees with plan.  Luiz Blare, MD 12/29/11 2234

## 2013-06-13 ENCOUNTER — Emergency Department (HOSPITAL_COMMUNITY)
Admission: EM | Admit: 2013-06-13 | Discharge: 2013-06-14 | Disposition: A | Discharge status: Home / Self Care | Payer: No Typology Code available for payment source | Attending: Emergency Medicine | Admitting: Emergency Medicine

## 2013-06-13 ENCOUNTER — Emergency Department (HOSPITAL_COMMUNITY): Payer: No Typology Code available for payment source

## 2013-06-13 ENCOUNTER — Encounter (HOSPITAL_COMMUNITY): Payer: Self-pay | Admitting: Emergency Medicine

## 2013-06-13 DIAGNOSIS — M6281 Muscle weakness (generalized): Secondary | ICD-10-CM | POA: Insufficient documentation

## 2013-06-13 DIAGNOSIS — S99929A Unspecified injury of unspecified foot, initial encounter: Principal | ICD-10-CM

## 2013-06-13 DIAGNOSIS — Y92410 Unspecified street and highway as the place of occurrence of the external cause: Secondary | ICD-10-CM | POA: Insufficient documentation

## 2013-06-13 DIAGNOSIS — S63599A Other specified sprain of unspecified wrist, initial encounter: Secondary | ICD-10-CM | POA: Insufficient documentation

## 2013-06-13 DIAGNOSIS — M25332 Other instability, left wrist: Secondary | ICD-10-CM

## 2013-06-13 DIAGNOSIS — S66819A Strain of other specified muscles, fascia and tendons at wrist and hand level, unspecified hand, initial encounter: Secondary | ICD-10-CM

## 2013-06-13 DIAGNOSIS — R29898 Other symptoms and signs involving the musculoskeletal system: Secondary | ICD-10-CM

## 2013-06-13 DIAGNOSIS — S99919A Unspecified injury of unspecified ankle, initial encounter: Principal | ICD-10-CM

## 2013-06-13 DIAGNOSIS — Y939 Activity, unspecified: Secondary | ICD-10-CM | POA: Insufficient documentation

## 2013-06-13 DIAGNOSIS — Z87440 Personal history of urinary (tract) infections: Secondary | ICD-10-CM | POA: Insufficient documentation

## 2013-06-13 DIAGNOSIS — M25572 Pain in left ankle and joints of left foot: Secondary | ICD-10-CM

## 2013-06-13 DIAGNOSIS — S8990XA Unspecified injury of unspecified lower leg, initial encounter: Secondary | ICD-10-CM | POA: Insufficient documentation

## 2013-06-13 DIAGNOSIS — Z3202 Encounter for pregnancy test, result negative: Secondary | ICD-10-CM | POA: Insufficient documentation

## 2013-06-13 LAB — BASIC METABOLIC PANEL WITH GFR
BUN: 6 mg/dL (ref 6–23)
CO2: 22 meq/L (ref 19–32)
Calcium: 9.7 mg/dL (ref 8.4–10.5)
Chloride: 106 meq/L (ref 96–112)
Creatinine, Ser: 0.75 mg/dL (ref 0.50–1.10)
GFR calc Af Amer: >90 mL/min
GFR calc non Af Amer: >90 mL/min
Glucose, Bld: 89 mg/dL (ref 70–99)
Potassium: 3.9 meq/L (ref 3.7–5.3)
Sodium: 143 meq/L (ref 137–147)

## 2013-06-13 LAB — RAPID URINE DRUG SCREEN, HOSP PERFORMED
AMPHETAMINES: NOT DETECTED
BARBITURATES: NOT DETECTED
BENZODIAZEPINES: NOT DETECTED
Cocaine: NOT DETECTED
Opiates: POSITIVE — AB
TETRAHYDROCANNABINOL: NOT DETECTED

## 2013-06-13 LAB — CBC
HCT: 41.1 % (ref 36.0–46.0)
Hemoglobin: 14.9 g/dL (ref 12.0–15.0)
MCH: 31.6 pg (ref 26.0–34.0)
MCHC: 36.3 g/dL — ABNORMAL HIGH (ref 30.0–36.0)
MCV: 87.1 fL (ref 78.0–100.0)
Platelets: 313 K/uL (ref 150–400)
RBC: 4.72 MIL/uL (ref 3.87–5.11)
RDW: 12 % (ref 11.5–15.5)
WBC: 6.5 K/uL (ref 4.0–10.5)

## 2013-06-13 LAB — PREGNANCY, URINE: Preg Test, Ur: NEGATIVE

## 2013-06-13 MED ORDER — SODIUM CHLORIDE 0.9 % IV BOLUS (SEPSIS)
1000.0000 mL | Freq: Once | INTRAVENOUS | Status: AC
  Administered 2013-06-13: 1000 mL via INTRAVENOUS

## 2013-06-13 MED ORDER — HYDROMORPHONE HCL PF 1 MG/ML IJ SOLN
1.0000 mg | Freq: Once | INTRAMUSCULAR | Status: DC
  Filled 2013-06-13: qty 1

## 2013-06-13 MED ORDER — MORPHINE SULFATE 4 MG/ML IJ SOLN
4.0000 mg | Freq: Once | INTRAMUSCULAR | Status: AC
  Administered 2013-06-13: 4 mg via INTRAVENOUS
  Filled 2013-06-13: qty 1

## 2013-06-13 NOTE — Progress Notes (Signed)
Chaplain responded to level II.  Pt seemed alert and responsive from chaplain assessment.  Chaplain services will be around in case a need arise with pt or family in the future.  However, initial assessment is that pt is able to communicate effectively and does not need emergency spiritual support at this time.

## 2013-06-13 NOTE — Discharge Instructions (Signed)
Take ibuprofen and tylenol for pain If you were given medicines take as directed.  If you are on coumadin or contraceptives realize their levels and effectiveness is altered by many different medicines.  If you have any reaction (rash, tongues swelling, other) to the medicines stop taking and see a physician.   Wear splint all day until cleared by ortho, limit lifting with left hand/ wrist.  Please follow up as directed and return to the ER or see a physician for new or worsening symptoms.  Thank you.

## 2013-06-13 NOTE — ED Notes (Signed)
When pt stood up to ambulate pt reports pain in left foot that radiates into left calf, unable to bare weight on left foot.

## 2013-06-13 NOTE — Progress Notes (Signed)
Orthopedic Tech Progress Note Patient Details:  Diamond Rodriguez [birth year] [id number]  Ortho Devices Type of Ortho Device: Velcro wrist splint Ortho Device/Splint Location: LUE Ortho Device/Splint Interventions: Ordered;Application   Jennye MoccasinHughes, Maximus Hoffert Craig 06/13/2013, 10:23 PM

## 2013-06-13 NOTE — ED Notes (Signed)
Pt transported to MRI 

## 2013-06-13 NOTE — ED Notes (Signed)
Dr zavitz at bedside 

## 2013-06-13 NOTE — ED Notes (Signed)
Spoke with MRI about transport

## 2013-06-13 NOTE — ED Notes (Signed)
Patient transported to MRI 

## 2013-06-13 NOTE — ED Notes (Signed)
RN spoke with MRI, pt tolerating well.
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
## 2013-06-14 ENCOUNTER — Emergency Department (HOSPITAL_COMMUNITY): Payer: No Typology Code available for payment source

## 2013-06-14 NOTE — ED Notes (Signed)
Per Jodi MourningZavitz, MD patient's left ankle wrapped with an ACE wrap for support.

## 2013-06-17 ENCOUNTER — Other Ambulatory Visit (HOSPITAL_COMMUNITY): Payer: Self-pay | Admitting: Orthopaedic Surgery

## 2013-06-17 DIAGNOSIS — M25532 Pain in left wrist: Secondary | ICD-10-CM

## 2013-07-04 ENCOUNTER — Ambulatory Visit (HOSPITAL_COMMUNITY)
Admission: RE | Admit: 2013-07-04 | Discharge: 2013-07-04 | Disposition: A | Discharge status: Home / Self Care | Payer: Medicaid Other | Source: Ambulatory Visit | Attending: Obstetrics | Admitting: Obstetrics

## 2013-07-04 DIAGNOSIS — M25532 Pain in left wrist: Secondary | ICD-10-CM

## 2013-07-04 DIAGNOSIS — M25539 Pain in unspecified wrist: Secondary | ICD-10-CM | POA: Insufficient documentation

## 2014-02-27 ENCOUNTER — Encounter (HOSPITAL_COMMUNITY): Payer: Self-pay | Admitting: Emergency Medicine

## 2016-03-04 ENCOUNTER — Encounter (HOSPITAL_COMMUNITY): Payer: Self-pay | Admitting: *Deleted

## 2016-03-04 ENCOUNTER — Ambulatory Visit (HOSPITAL_COMMUNITY)
Admission: EM | Admit: 2016-03-04 | Discharge: 2016-03-04 | Disposition: A | Discharge status: Home / Self Care | Payer: Medicaid Other | Attending: Family Medicine | Admitting: Family Medicine

## 2016-03-04 DIAGNOSIS — R102 Pelvic and perineal pain: Secondary | ICD-10-CM

## 2016-03-04 LAB — POCT URINALYSIS DIP (DEVICE)
BILIRUBIN URINE: NEGATIVE
GLUCOSE, UA: NEGATIVE mg/dL
Ketones, ur: NEGATIVE mg/dL
LEUKOCYTES UA: NEGATIVE
NITRITE: NEGATIVE
Protein, ur: NEGATIVE mg/dL
SPECIFIC GRAVITY, URINE: 1.025 (ref 1.005–1.030)
Urobilinogen, UA: 1 mg/dL (ref 0.0–1.0)
pH: 6.5 (ref 5.0–8.0)

## 2016-03-04 LAB — POCT PREGNANCY, URINE: Preg Test, Ur: NEGATIVE

## 2016-03-04 NOTE — ED Triage Notes (Signed)
Low   abd  Pain  With   l   Side  Pain  X  sev   Weeks       Vaginal  Bleeding  At  Times           Ambulated  With  Steady  Fluid  Gait

## 2016-03-04 NOTE — Discharge Instructions (Signed)
See [facility] doctor [facility] problem conyinues

## 2016-03-04 NOTE — ED Provider Notes (Signed)
[facility]    CSN: [id number] Arrival date & time: 03/04/16  1435     History   Chief Complaint Chief Complaint  Patient presents with  . Abdominal Pain    HPI Diamond Rodriguez is a 25 y.o. female.   The history is provided by the patient.  Abdominal Pain  Pain location:  LLQ Pain quality: shooting   Pain radiates to:  Does not radiate Pain severity:  Mild Onset quality:  Sudden Duration:  3 weeks Progression:  Waxing and waning Chronicity:  New (vag bleeding assoc with left pelvic pain) Context comment:  Change from bcp to nexplanon birth control method. Associated symptoms: vaginal bleeding   Associated symptoms: no constipation, no diarrhea, no dysuria, no nausea, no vaginal discharge and no vomiting     Past Medical History:  Diagnosis Date  . No pertinent past medical history   . UTI (lower urinary tract infection)     Patient Active Problem List   Diagnosis Date Noted  . Normal delivery 04/12/2011    Past Surgical History:  Procedure Laterality Date  . NO PAST SURGERIES      OB History    Gravida Para Term Preterm AB Living   1 1 1     1    SAB TAB Ectopic Multiple Live Births           1       Home Medications    Prior to Admission medications   Not on File    Family History Family History  Problem Relation Age of Onset  . Diabetes Maternal Grandmother     Social History Social History  Substance Use Topics  . Smoking status: Never Smoker  . Smokeless tobacco: Not on file  . Alcohol use No     Allergies   Patient has no known allergies.   Review of Systems Review of Systems  Gastrointestinal: Positive for abdominal pain. Negative for constipation, diarrhea, nausea and vomiting.  Genitourinary: Positive for pelvic pain and vaginal bleeding. Negative for dysuria, frequency, vaginal discharge and vaginal pain.  All other systems reviewed and are negative.    Physical Exam Triage Vital Signs ED Triage Vitals    Enc Vitals Group     BP 03/04/16 1450 119/56     Pulse Rate 03/04/16 1450 72     Resp 03/04/16 1450 18     Temp 03/04/16 1450 98.9 F (37.2 C)     Temp Source 03/04/16 1450 Oral     SpO2 03/04/16 1450 100 %     Weight --      Height --      Head Circumference --      Peak Flow --      Pain Score 03/04/16 1457 2     Pain Loc --      Pain Edu? --      Excl. in GC? --    No data found.   Updated Vital Signs BP 119/56 (BP Location: Right Arm)   Pulse 72   Temp 98.9 F (37.2 C) (Oral)   Resp 18   SpO2 100%   Visual Acuity Right Eye Distance:   Left Eye Distance:   Bilateral Distance:    Right Eye Near:   Left Eye Near:    Bilateral Near:     Physical Exam  Constitutional: She appears well-developed and well-nourished.  Abdominal: Soft. Bowel sounds are normal. She exhibits no mass. There is tenderness. There is no rebound and  no guarding. No hernia.  Skin: Skin is warm and dry.  Nursing note and vitals reviewed.    UC Treatments / Results  Labs (all labs ordered are listed, but only abnormal results are displayed) Labs Reviewed  POCT URINALYSIS DIP (DEVICE) - Abnormal; Notable for the following:       Result Value   Hgb urine dipstick TRACE (*)    All other components within normal limits  POCT PREGNANCY, URINE    EKG  EKG Interpretation None       Radiology No results found.  Procedures Procedures (including critical care time)  Medications Ordered in UC Medications - No data to display   Initial Impression / Assessment and Plan / UC Course  I have reviewed the triage vital signs and the nursing notes.  Pertinent labs & imaging results that were available during my care of the patient were reviewed by me and considered in my medical decision making (see chart for details).  Clinical Course       Final Clinical Impressions(s) / UC Diagnoses   Final diagnoses:  None    New Prescriptions New Prescriptions   No medications on file      Linna HoffJames D Lismary Kiehn, MD 03/04/16 1526

## 2016-03-05 ENCOUNTER — Encounter (HOSPITAL_COMMUNITY): Payer: Self-pay

## 2016-03-05 ENCOUNTER — Inpatient Hospital Stay (HOSPITAL_COMMUNITY)
Admission: AD | Admit: 2016-03-05 | Discharge: 2016-03-05 | Disposition: A | Discharge status: Home / Self Care | Payer: Medicaid Other | Source: Ambulatory Visit | Attending: Obstetrics and Gynecology | Admitting: Obstetrics and Gynecology

## 2016-03-05 DIAGNOSIS — R102 Pelvic and perineal pain: Secondary | ICD-10-CM | POA: Insufficient documentation

## 2016-03-05 DIAGNOSIS — B9689 Other specified bacterial agents as the cause of diseases classified elsewhere: Secondary | ICD-10-CM | POA: Insufficient documentation

## 2016-03-05 DIAGNOSIS — N760 Acute vaginitis: Secondary | ICD-10-CM | POA: Insufficient documentation

## 2016-03-05 DIAGNOSIS — F17210 Nicotine dependence, cigarettes, uncomplicated: Secondary | ICD-10-CM | POA: Insufficient documentation

## 2016-03-05 DIAGNOSIS — R109 Unspecified abdominal pain: Secondary | ICD-10-CM

## 2016-03-05 HISTORY — DX: Chlamydial infection, unspecified: A74.9

## 2016-03-05 LAB — WET PREP, GENITAL
Sperm: NONE SEEN
Trich, Wet Prep: NONE SEEN
Yeast Wet Prep HPF POC: NONE SEEN

## 2016-03-05 NOTE — MAU Provider Note (Signed)
  History     CSN: [id number]  Arrival date and time: 03/05/16 1430   First Provider Initiated Contact with Patient 03/05/16 1528      Chief Complaint  Patient presents with  . Vaginal Bleeding  . Abdominal Pain   HPI: [name] is a 25 yo G1P1  Who presents to the MAU with c/o some lower abd pain and occ Vb.  Pain started a few days ago. She describes as comes and goes, pressure like, worsens with prolonged sitting. She reports VB is heavy to just light spotting. Last noted this morning. She denies F/C/N/V bowel or bladder dysfunction. LMP was end of Oct. Lasdt IC was several months ago. Tolerating diet. She has not taken any thing for the pain. 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  She has Nexplanon. Placed in 2016.   She was seen at a UC yesterday with a negative UA and UPT.  Was told there that pain was probable an ovarian cyst and instructed to follow up here.   She denies any chronic medical problems.    Past Medical History:  Diagnosis Date  . Chlamydia   . UTI (lower urinary tract infection)     Past Surgical History:  Procedure Laterality Date  . WISDOM TOOTH EXTRACTION      Family History  Problem Relation Age of Onset  . Diabetes Maternal Grandmother     Social History  Substance Use Topics  . Smoking status: Current Some Day Smoker    Packs/day: 0.25  . Smokeless tobacco: Never Used     Comment: Black & Milds occasionally  . Alcohol use No    Allergies: No Known Allergies  No prescriptions prior to admission.    Review of Systems  Constitutional: Negative.   Respiratory: Negative.   Cardiovascular: Negative.   Gastrointestinal: Negative.   Genitourinary: Negative.    Physical Exam   Blood pressure 119/73, pulse 82, temperature 98.2 F (36.8 C), temperature source Oral, resp. rate 16, height 5' 2.5" (1.588 m), weight 166 lb 9.6 oz (75.6 kg), last menstrual period 03/02/2016.  Physical Exam  Constitutional: She appears well-developed and well-nourished.   Cardiovascular: Normal rate and normal heart sounds.   Respiratory: Effort normal and breath sounds normal.  GI: Soft. Bowel sounds are normal. There is no rebound and no guarding.  Mild diffuse tenderness  Genitourinary:  Genitourinary Comments: Nl EGBUS, no bleeding or vaginal d/c noted, cultures obtained, bladder non tender, uterus small mobile, mild left adnexa tenderness    MAU Course  Procedures Vaginal cultures  Pt does not appear to be toxic, negative pregnancy thus ectopic ruled out. Suspect ovarian cyst with sponetaous ovulation causing VB in mist of Nexplanon.   Assessment and Plan  Abd/pelvic pain VB  Culture obtained. Discussed ordering GYN U/S with pt to further evaluate for ovarian cyst.  Pt declined due to wait. Pt instructed to take Motrin or Aleve for discomfort. F/U with GYN of choice. To return to MAU or ER if Sx do not improve for for any concerns.  Diamond StaggersMichael L Darla Rodriguez 03/05/2016, 3:55 PM

## 2016-03-05 NOTE — Discharge Instructions (Signed)
Ovarian Cyst An ovarian cyst is a fluid-filled sac that forms on an ovary. The ovaries are small organs that produce eggs in women. Various types of cysts can form on the ovaries. Most are not cancerous. Many do not cause problems, and they often go away on their own. Some may cause symptoms and require treatment. Common types of ovarian cysts include:  Functional cysts--These cysts may occur every month during the menstrual cycle. This is normal. The cysts usually go away with the next menstrual cycle if the woman does not get pregnant. Usually, there are no symptoms with a functional cyst.  Endometrioma cysts--These cysts form from the tissue that lines the uterus. They are also called "chocolate cysts" because they become filled with blood that turns brown. This type of cyst can cause pain in the lower abdomen during intercourse and with your menstrual period.  Cystadenoma cysts--This type develops from the cells on the outside of the ovary. These cysts can get very big and cause lower abdomen pain and pain with intercourse. This type of cyst can twist on itself, cut off its blood supply, and cause severe pain. It can also easily rupture and cause a lot of pain.  Dermoid cysts--This type of cyst is sometimes found in both ovaries. These cysts may contain different kinds of body tissue, such as skin, teeth, hair, or cartilage. They usually do not cause symptoms unless they get very big.  Theca lutein cysts--These cysts occur when too much of a certain hormone (human chorionic gonadotropin) is produced and overstimulates the ovaries to produce an egg. This is most common after procedures used to assist with the conception of a baby (in vitro fertilization). CAUSES   Fertility drugs can cause a condition in which multiple large cysts are formed on the ovaries. This is called ovarian hyperstimulation syndrome.  A condition called polycystic ovary syndrome can cause hormonal imbalances that can lead to  nonfunctional ovarian cysts. SIGNS AND SYMPTOMS  Many ovarian cysts do not cause symptoms. If symptoms are present, they may include:  Pelvic pain or pressure.  Pain in the lower abdomen.  Pain during sexual intercourse.  Increasing girth (swelling) of the abdomen.  Abnormal menstrual periods.  Increasing pain with menstrual periods.  Stopping having menstrual periods without being pregnant. DIAGNOSIS  These cysts are commonly found during a routine or annual pelvic exam. Tests may be ordered to find out more about the cyst. These tests may include:  Ultrasound.  X-ray of the pelvis.  CT scan.  MRI.  Blood tests. TREATMENT  Many ovarian cysts go away on their own without treatment. Your health care provider may want to check your cyst regularly for 2-3 months to see if it changes. For women in menopause, it is particularly important to monitor a cyst closely because of the higher rate of ovarian cancer in menopausal women. When treatment is needed, it may include any of the following:  A procedure to drain the cyst (aspiration). This may be done using a long needle and ultrasound. It can also be done through a laparoscopic procedure. This involves using a thin, lighted tube with a tiny camera on the end (laparoscope) inserted through a small incision.  Surgery to remove the whole cyst. This may be done using laparoscopic surgery or an open surgery involving a larger incision in the lower abdomen.  Hormone treatment or birth control pills. These methods are sometimes used to help dissolve a cyst. HOME CARE INSTRUCTIONS   Only take over-the-counter   or prescription medicines as directed by your health care provider.  Follow up with your health care provider as directed.  Get regular pelvic exams and Pap tests. SEEK MEDICAL CARE IF:   Your periods are late, irregular, or painful, or they stop.  Your pelvic pain or abdominal pain does not go away.  Your abdomen becomes  larger or swollen.  You have pressure on your bladder or trouble emptying your bladder completely.  You have pain during sexual intercourse.  You have feelings of fullness, pressure, or discomfort in your stomach.  You lose weight for no apparent reason.  You feel generally ill.  You become constipated.  You lose your appetite.  You develop acne.  You have an increase in body and facial hair.  You are gaining weight, without changing your exercise and eating habits.  You think you are pregnant. SEEK IMMEDIATE MEDICAL CARE IF:   You have increasing abdominal pain.  You feel sick to your stomach (nauseous), and you throw up (vomit).  You develop a fever that comes on suddenly.  You have abdominal pain during a bowel movement.  Your menstrual periods become heavier than usual. MAKE SURE YOU:  Understand these instructions.  Will watch your condition.  Will get help right away if you are not doing well or get worse.   This information is not intended to replace advice given to you by your health care provider. Make sure you discuss any questions you have with your health care provider.   Document Released: 04/14/2005 Document Revised: 04/19/2013 Document Reviewed: 12/20/2012 Elsevier Interactive Patient Education 2016 [employer]  

## 2016-03-05 NOTE — MAU Note (Signed)
Having lower abd pain. Symptoms as if on period. Has Nexplanon, 2016, was on Depo before that, since 2012- had about 6 months between. Now is bleeding. Had blood clocks about 3 days ago, having lower abd and side pain, been intense- started about 3 days ago.

## 2016-03-06 ENCOUNTER — Telehealth: Payer: Self-pay

## 2016-03-06 LAB — GC/CHLAMYDIA PROBE AMP (~~LOC~~) NOT AT ARMC
Chlamydia: NEGATIVE
NEISSERIA GONORRHEA: NEGATIVE

## 2016-03-06 MED ORDER — METRONIDAZOLE 500 MG PO TABS: 500.0000 mg | ORAL_TABLET | Freq: Two times a day (BID) | ORAL | 0 refills | Status: DC

## 2016-03-06 NOTE — Telephone Encounter (Signed)
Per Dr. Alysia PennaErvin, pt has BV and needs to be treated with Flagyl 500 mg po bid for 7 days.  Notified pt of results and the need for treatment.  Pt stated understanding with no further questions.

## 2017-07-18 ENCOUNTER — Encounter (HOSPITAL_COMMUNITY): Payer: Self-pay | Admitting: Emergency Medicine

## 2017-07-18 ENCOUNTER — Emergency Department (HOSPITAL_COMMUNITY)
Admission: EM | Admit: 2017-07-18 | Discharge: 2017-07-19 | Disposition: A | Discharge status: Home / Self Care | Payer: Self-pay | Attending: Emergency Medicine | Admitting: Emergency Medicine

## 2017-07-18 DIAGNOSIS — F17210 Nicotine dependence, cigarettes, uncomplicated: Secondary | ICD-10-CM | POA: Insufficient documentation

## 2017-07-18 DIAGNOSIS — R110 Nausea: Secondary | ICD-10-CM | POA: Insufficient documentation

## 2017-07-18 DIAGNOSIS — Z79899 Other long term (current) drug therapy: Secondary | ICD-10-CM | POA: Insufficient documentation

## 2017-07-18 DIAGNOSIS — T7840XA Allergy, unspecified, initial encounter: Secondary | ICD-10-CM | POA: Insufficient documentation

## 2017-07-18 MED ORDER — DIPHENHYDRAMINE HCL 50 MG/ML IJ SOLN
25.0000 mg | Freq: Once | INTRAMUSCULAR | Status: AC
  Administered 2017-07-18: 25 mg via INTRAVENOUS
  Filled 2017-07-18: qty 1

## 2017-07-18 MED ORDER — FAMOTIDINE IN NACL 20-0.9 MG/50ML-% IV SOLN
20.0000 mg | Freq: Once | INTRAVENOUS | Status: AC
  Administered 2017-07-18: 20 mg via INTRAVENOUS
  Filled 2017-07-18: qty 50

## 2017-07-18 MED ORDER — EPINEPHRINE 0.3 MG/0.3ML IJ SOAJ
0.3000 mg | Freq: Once | INTRAMUSCULAR | Status: AC
  Administered 2017-07-18: 0.3 mg via INTRAMUSCULAR
  Filled 2017-07-18: qty 0.3

## 2017-07-18 MED ORDER — METHYLPREDNISOLONE SODIUM SUCC 125 MG IJ SOLR
125.0000 mg | Freq: Once | INTRAMUSCULAR | Status: AC
  Administered 2017-07-18: 125 mg via INTRAVENOUS
  Filled 2017-07-18: qty 2

## 2017-07-18 NOTE — ED Provider Notes (Addendum)
[facility] EMERGENCY DEPARTMENT Provider Note   CSN: [id number] Arrival date & time: 07/18/17  2221     History   Chief Complaint Chief Complaint  Patient presents with  . Allergic Reaction    HPI Diamond Rodriguez is a 27 y.o. female.  Patient presents to the emergency department with a chief complaint of allergic reaction.  She states that she woke up with throat swelling sensation and some difficulty breathing.  She states that she has had to repeatedly clear her throat and feels like it is tightening.  She reports having had some nausea, but denies vomiting or diarrhea.  She denies rash.  She denies having had anything like this before the past.  She states that she ate a banana before taking a nap, but otherwise is unable to think of any potential allergic contacts.  The history is provided by the patient. No language interpreter was used.    Past Medical History:  Diagnosis Date  . Chlamydia   . UTI (lower urinary tract infection)     Patient Active Problem List   Diagnosis Date Noted  . Normal delivery 04/12/2011    Past Surgical History:  Procedure Laterality Date  . WISDOM TOOTH EXTRACTION       OB History    Gravida  1   Para  1   Term  1   Preterm      AB      Living  1     SAB      TAB      Ectopic      Multiple      Live Births  1            Home Medications    Prior to Admission medications   Medication Sig Start Date End Date Taking? Authorizing Provider  etonogestrel (NEXPLANON) 68 MG IMPL implant 1 each by Subdermal route once.    [provider]  metroNIDAZOLE (FLAGYL) 500 MG tablet Take 1 tablet (500 mg total) by mouth 2 (two) times daily. 03/06/16   Hermina Staggers, MD  albuterol (VENTOLIN HFA) 108 (90 BASE) MCG/ACT inhaler Inhale 2 puffs into the lungs every 6 (six) hours as needed. Shortness of breath 04/13/11 07/07/11  Antionette Char, MD    Family History Family History  Problem  Relation Age of Onset  . Diabetes Maternal Grandmother     Social History Social History   Tobacco Use  . Smoking status: Current Some Day Smoker    Packs/day: 0.25  . Smokeless tobacco: Never Used  . Tobacco comment: Black & Milds occasionally  Substance Use Topics  . Alcohol use: No  . Drug use: No     Allergies   Patient has no known allergies.   Review of Systems Review of Systems  All other systems reviewed and are negative.    Physical Exam Updated Vital Signs Ht 5\' 2"  (1.575 m)   Wt 76.7 kg (169 lb)   BMI 30.91 kg/m   Physical Exam  Constitutional: She is oriented to person, place, and time. She appears well-developed and well-nourished.  HENT:  Head: Normocephalic and atraumatic.  Mild edema of oropharynx Repeatedly clearing throat, but tolerating secretions, no stridor Mild swelling of eyelids and lips  Eyes: Pupils are equal, round, and reactive to light. Conjunctivae and EOM are normal.  Neck: Normal range of motion. Neck supple.  Cardiovascular: Normal rate and regular rhythm. Exam reveals no gallop and no friction rub.  No murmur heard. Pulmonary/Chest: Effort normal and breath sounds normal. No respiratory distress. She has no wheezes. She has no rales. She exhibits no tenderness.  No wheezing  Abdominal: Soft. Bowel sounds are normal. She exhibits no distension and no mass. There is no tenderness. There is no rebound and no guarding.  Musculoskeletal: Normal range of motion. She exhibits no edema or tenderness.  Neurological: She is alert and oriented to person, place, and time.  Skin: Skin is warm and dry.  No visible rash  Psychiatric: She has a normal mood and affect. Her behavior is normal. Judgment and thought content normal.  Nursing note and vitals reviewed.    ED Treatments / Results  Labs (all labs ordered are listed, but only abnormal results are displayed) Labs Reviewed - No data to display  EKG None  Radiology No results  found.  Procedures Procedures (including critical care time)  Medications Ordered in ED Medications  EPINEPHrine (EPI-PEN) injection 0.3 mg (0.3 mg Intramuscular Given 07/18/17 2242)  methylPREDNISolone sodium succinate (SOLU-MEDROL) 125 mg/2 mL injection 125 mg (125 mg Intravenous Given 07/18/17 2242)  diphenhydrAMINE (BENADRYL) injection 25 mg (25 mg Intravenous Given 07/18/17 2242)  famotidine (PEPCID) IVPB 20 mg premix (20 mg Intravenous New Bag/Given 07/18/17 2246)     Initial Impression / Assessment and Plan / ED Course  I have reviewed the triage vital signs and the nursing notes.  Pertinent labs & imaging results that were available during my care of the patient were reviewed by me and considered in my medical decision making (see chart for details).     Patient with throat swelling sensation and nausea.  She awakened with the symptoms.  She states that the symptoms are worsening.  She reports having some shortness of breath, but has no wheezing.  There is no rash.  However, given mild edema in oropharynx, will give EpiPen.  Will also give Solu-Medrol, Pepcid, and Benadryl.  Will reassess.  Patient reassessed, feeling improved, no longer grunting.  Appears comfortable. Swelling of oropharynx, eyelids and lips has resolved  2:03 AM Reassessed after approximately 4 hours.  No recurrence of symptoms.  We will discharged home.  Instructed in the use of EpiPen.  Return precautions given.  Recommend follow-up with allergist.  Final Clinical Impressions(s) / ED Diagnoses   Final diagnoses:  Allergic reaction, initial encounter    ED Discharge Orders        Ordered    EPINEPHrine (EPIPEN 2-PAK) 0.3 mg/0.3 mL IJ SOAJ injection  Once PRN     07/19/17 0202    predniSONE (DELTASONE) 20 MG tablet  Daily     07/19/17 0202    diphenhydrAMINE (BENADRYL) 25 MG tablet  Every 8 hours PRN     07/19/17 0202       Roxy HorsemanBrowning, Maicie Vanderloop, PA-C 07/19/17 0204    Roxy HorsemanBrowning, Guilianna Mckoy, PA-C 07/19/17  [phone]    Gilda CreasePollina, Christopher J, MD 07/19/17 [phone]

## 2017-07-18 NOTE — ED Triage Notes (Signed)
Reports eating a banana then going to sleep.  Woke up with itching, throat itching and feeling like its swelling,  Clearing throat frequently in triage.  Also c/o swelling in face.  Has not taken anything.

## 2017-07-19 MED ORDER — EPINEPHRINE 0.3 MG/0.3ML IJ SOAJ: 0.3000 mg | Freq: Once | INTRAMUSCULAR | 1 refills | Status: DC | PRN

## 2017-07-19 MED ORDER — DIPHENHYDRAMINE HCL 25 MG PO TABS
25.0000 mg | ORAL_TABLET | Freq: Three times a day (TID) | ORAL | 0 refills | Status: DC | PRN
Start: 2017-07-19 — End: 2018-11-03

## 2017-07-19 MED ORDER — PREDNISONE 20 MG PO TABS: 40.0000 mg | ORAL_TABLET | Freq: Every day | ORAL | 0 refills | Status: DC

## 2017-07-19 NOTE — Discharge Instructions (Signed)
If your symptoms return, inject yourself with the epi pen and call 911.  Otherwise, please follow-up with the allergist.

## 2017-12-31 ENCOUNTER — Emergency Department (HOSPITAL_COMMUNITY)
Admission: EM | Admit: 2017-12-31 | Discharge: 2017-12-31 | Disposition: A | Discharge status: Home / Self Care | Payer: No Typology Code available for payment source | Attending: Emergency Medicine | Admitting: Emergency Medicine

## 2017-12-31 ENCOUNTER — Emergency Department (HOSPITAL_COMMUNITY): Payer: No Typology Code available for payment source

## 2017-12-31 ENCOUNTER — Encounter (HOSPITAL_COMMUNITY): Payer: Self-pay

## 2017-12-31 DIAGNOSIS — Z79899 Other long term (current) drug therapy: Secondary | ICD-10-CM | POA: Diagnosis not present

## 2017-12-31 DIAGNOSIS — M545 Low back pain, unspecified: Secondary | ICD-10-CM

## 2017-12-31 DIAGNOSIS — F17290 Nicotine dependence, other tobacco product, uncomplicated: Secondary | ICD-10-CM | POA: Insufficient documentation

## 2017-12-31 DIAGNOSIS — M7918 Myalgia, other site: Secondary | ICD-10-CM | POA: Insufficient documentation

## 2017-12-31 DIAGNOSIS — M542 Cervicalgia: Secondary | ICD-10-CM | POA: Insufficient documentation

## 2017-12-31 MED ORDER — IBUPROFEN 400 MG PO TABS
600.0000 mg | ORAL_TABLET | Freq: Once | ORAL | Status: AC
  Administered 2017-12-31: 600 mg via ORAL
  Filled 2017-12-31: qty 1

## 2017-12-31 MED ORDER — METHOCARBAMOL 500 MG PO TABS
1000.0000 mg | ORAL_TABLET | Freq: Once | ORAL | Status: AC
  Administered 2017-12-31: 1000 mg via ORAL
  Filled 2017-12-31: qty 2

## 2017-12-31 MED ORDER — IBUPROFEN 600 MG PO TABS: 600.0000 mg | ORAL_TABLET | Freq: Four times a day (QID) | ORAL | 0 refills | Status: DC | PRN

## 2017-12-31 MED ORDER — METHOCARBAMOL 500 MG PO TABS: 500.0000 mg | ORAL_TABLET | Freq: Two times a day (BID) | ORAL | 0 refills | Status: DC
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
## 2017-12-31 NOTE — ED Notes (Signed)
Patient verbalizes understanding of discharge instructions. Opportunity for questioning and answers were provided. Armband removed by staff, pt discharged from ED ambulatory.   
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
## 2017-12-31 NOTE — ED Notes (Signed)
Patient transported to X-ray 
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
## 2017-12-31 NOTE — ED Notes (Signed)
Patient transported to Ultrasound 

## 2018-01-05 ENCOUNTER — Emergency Department (HOSPITAL_COMMUNITY)
Admission: EM | Admit: 2018-01-05 | Discharge: 2018-01-05 | Disposition: A | Discharge status: Home / Self Care | Payer: No Typology Code available for payment source | Attending: Emergency Medicine | Admitting: Emergency Medicine

## 2018-01-05 ENCOUNTER — Encounter (HOSPITAL_COMMUNITY): Payer: Self-pay | Admitting: *Deleted

## 2018-01-05 ENCOUNTER — Emergency Department (HOSPITAL_COMMUNITY): Payer: No Typology Code available for payment source

## 2018-01-05 DIAGNOSIS — S161XXD Strain of muscle, fascia and tendon at neck level, subsequent encounter: Secondary | ICD-10-CM

## 2018-01-05 DIAGNOSIS — M7918 Myalgia, other site: Secondary | ICD-10-CM | POA: Diagnosis not present

## 2018-01-05 DIAGNOSIS — F17210 Nicotine dependence, cigarettes, uncomplicated: Secondary | ICD-10-CM | POA: Insufficient documentation

## 2018-01-05 DIAGNOSIS — Z79899 Other long term (current) drug therapy: Secondary | ICD-10-CM | POA: Diagnosis not present

## 2018-01-05 MED ORDER — KETOROLAC TROMETHAMINE 15 MG/ML IJ SOLN
15.0000 mg | Freq: Once | INTRAMUSCULAR | Status: AC
  Administered 2018-01-05: 15 mg via INTRAMUSCULAR
  Filled 2018-01-05: qty 1

## 2018-01-05 MED ORDER — METHOCARBAMOL 500 MG PO TABS
500.0000 mg | ORAL_TABLET | Freq: Once | ORAL | Status: AC
  Administered 2018-01-05: 500 mg via ORAL
  Filled 2018-01-05: qty 1

## 2018-01-05 MED ORDER — LIDOCAINE 5 % EX PTCH: 1.0000 | MEDICATED_PATCH | CUTANEOUS | 0 refills | Status: DC
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
## 2018-01-05 NOTE — Discharge Instructions (Addendum)
Take ibuprofen 3 times a day with meals.  Do not take other anti-inflammatories at the same time (Advil, Motrin, naproxen, aleve). You may supplement with Tylenol if you need further pain control. Continue taking robaxin as needed for muscle stiffness or soreness.   Use ice packs or heating pads if this helps control your pain. Use muscle creams (salonpas, icy hot, bengay) as needed for pain.  Use lidocaine patch as needed for pain.  Do back ans muscle stretches.  You will likely have continued muscle stiffness and soreness over the next couple days.  Follow-up with primary care in 1 week if your symptoms are not improving. Return to the emergency room if you develop vision changes, vomiting, slurred speech, numbness, loss of bowel or bladder control, or any new or worsening symptoms.

## 2018-04-09 ENCOUNTER — Other Ambulatory Visit: Payer: Self-pay

## 2018-04-09 ENCOUNTER — Encounter (HOSPITAL_COMMUNITY): Payer: Self-pay | Admitting: Emergency Medicine

## 2018-04-09 ENCOUNTER — Ambulatory Visit (HOSPITAL_COMMUNITY)
Admission: EM | Admit: 2018-04-09 | Discharge: 2018-04-09 | Disposition: A | Discharge status: Home / Self Care | Payer: Commercial Managed Care - PPO | Attending: Internal Medicine | Admitting: Internal Medicine

## 2018-04-09 DIAGNOSIS — K0889 Other specified disorders of teeth and supporting structures: Secondary | ICD-10-CM | POA: Insufficient documentation

## 2018-04-09 DIAGNOSIS — K047 Periapical abscess without sinus: Secondary | ICD-10-CM | POA: Diagnosis not present

## 2018-04-09 MED ORDER — KETOROLAC TROMETHAMINE 60 MG/2ML IM SOLN
60.0000 mg | Freq: Once | INTRAMUSCULAR | Status: AC
  Administered 2018-04-09: 60 mg via INTRAMUSCULAR

## 2018-04-09 MED ORDER — CHLORHEXIDINE GLUCONATE 0.12 % MT SOLN: 15.0000 mL | Freq: Two times a day (BID) | OROMUCOSAL | 0 refills | Status: DC

## 2018-04-09 MED ORDER — CEFTRIAXONE SODIUM 1 G IJ SOLR
1.0000 g | Freq: Once | INTRAMUSCULAR | Status: AC
Start: 2018-04-09 — End: 2018-04-09
  Administered 2018-04-09: 1 g via INTRAMUSCULAR

## 2018-04-09 MED ORDER — AMOXICILLIN-POT CLAVULANATE 875-125 MG PO TABS: 1.0000 | ORAL_TABLET | Freq: Two times a day (BID) | ORAL | 0 refills | Status: AC

## 2018-04-09 MED ORDER — KETOROLAC TROMETHAMINE 60 MG/2ML IM SOLN
INTRAMUSCULAR | Status: AC
Start: 2018-04-09 — End: ?
  Filled 2018-04-09: qty 2

## 2018-04-09 MED ORDER — CEFTRIAXONE SODIUM 1 G IJ SOLR
INTRAMUSCULAR | Status: AC
  Filled 2018-04-09: qty 10

## 2018-04-09 NOTE — ED Triage Notes (Signed)
PT chipped a tooth several months ago. PT has had pain, pressure, and swelling to area for the last 2 days.

## 2018-04-09 NOTE — ED Provider Notes (Signed)
[facility]    CSN: [id number] Arrival date & time: 04/09/18  1650     History   Chief Complaint Chief Complaint  Patient presents with  . Dental Problem    HPI Diamond Rodriguez is a 27 y.o. female.   She presents today with pain in a left upper molar for a couple of days, with swelling in the left cheek radiating into the left neck that is much worse today.  No fever.  Does have some difficulty opening her mouth due to pain.  Has an appointment with a dentist in about 2 weeks.  Chipped a tooth in that area several months ago, but did not start having trouble until very recently.    HPI  Past Medical History:  Diagnosis Date  . Chlamydia   . UTI (lower urinary tract infection)     Patient Active Problem List   Diagnosis Date Noted  . Normal delivery 04/12/2011    Past Surgical History:  Procedure Laterality Date  . WISDOM TOOTH EXTRACTION      OB History    Gravida  1   Para  1   Term  1   Preterm      AB      Living  1     SAB      TAB      Ectopic      Multiple      Live Births  1            Home Medications    Prior to Admission medications   Medication Sig Start Date End Date Taking? Authorizing Provider  etonogestrel (NEXPLANON) 68 MG IMPL implant 1 each by Subdermal route once.   Yes [provider]  amoxicillin-clavulanate (AUGMENTIN) 875-125 MG tablet Take 1 tablet by mouth 2 (two) times daily for 10 days. 04/09/18 04/19/18  Isa Rankin, MD  chlorhexidine (PERIDEX) 0.12 % solution Use as directed 15 mLs in the mouth or throat 2 (two) times daily. Swish/spit 04/09/18   Isa Rankin, MD  diphenhydrAMINE (BENADRYL) 25 MG tablet Take 1 tablet (25 mg total) by mouth every 8 (eight) hours as needed. 07/19/17   Roxy Horseman, PA-C  EPINEPHrine (EPIPEN 2-PAK) 0.3 mg/0.3 mL IJ SOAJ injection Inject 0.3 mLs (0.3 mg total) into the muscle once as needed for up to 1 dose (for severe allergic reaction).  CAll 911 immediately if you have to use this medicine 07/19/17   Roxy Horseman, PA-C  ibuprofen (ADVIL,MOTRIN) 600 MG tablet Take 1 tablet (600 mg total) by mouth every 6 (six) hours as needed. 12/31/17   Dartha Lodge, PA-C  lidocaine (LIDODERM) 5 % Place 1 patch onto the skin daily. Remove & Discard patch within 12 hours or as directed by MD 01/05/18   Caccavale, Sophia, PA-C  methocarbamol (ROBAXIN) 500 MG tablet Take 1 tablet (500 mg total) by mouth 2 (two) times daily. 12/31/17   Dartha Lodge, PA-C  metroNIDAZOLE (FLAGYL) 500 MG tablet Take 1 tablet (500 mg total) by mouth 2 (two) times daily. 03/06/16   Hermina Staggers, MD  predniSONE (DELTASONE) 20 MG tablet Take 2 tablets (40 mg total) by mouth daily. 07/19/17   Roxy Horseman, PA-C  albuterol (VENTOLIN HFA) 108 (90 BASE) MCG/ACT inhaler Inhale 2 puffs into the lungs every 6 (six) hours as needed. Shortness of breath 04/13/11 07/07/11  Antionette Char, MD    Family History Family History  Problem Relation Age of Onset  .  Diabetes Maternal Grandmother     Social History Social History   Tobacco Use  . Smoking status: Current Some Day Smoker    Packs/day: 0.25  . Smokeless tobacco: Never Used  . Tobacco comment: Black & Milds occasionally  Substance Use Topics  . Alcohol use: No  . Drug use: No     Allergies   Banana   Review of Systems Review of Systems  All other systems reviewed and are negative.    Physical Exam Triage Vital Signs ED Triage Vitals  Enc Vitals Group     BP 04/09/18 1710 128/68     Pulse Rate 04/09/18 1710 78     Resp 04/09/18 1710 16     Temp 04/09/18 1710 99.3 F (37.4 C)     Temp Source 04/09/18 1710 Oral     SpO2 04/09/18 1710 99 %     Weight --      Height --      Pain Score 04/09/18 1708 10     Pain Loc --    Updated Vital Signs BP 128/68   Pulse 78   Temp 99.3 F (37.4 C) (Oral)   Resp 16   LMP 03/19/2018   SpO2 99%  Physical Exam Vitals signs and nursing note  reviewed.  Constitutional:      General: She is not in acute distress.    Comments: Alert, nicely groomed  HENT:     Head: Atraumatic.     Comments: Bilateral TMs are translucent, no erythema No significant nasal congestion Dentition largely intact, with trismus.  Swelling and erythema of the gum above the left upper molars, quite tender to palpation.  Left cheek is somewhat swollen.  No induration palpable although there is diffuse tenderness.  Shotty lymphadenopathy in the left sublingual area and left anterior neck. Eyes:     Comments: Conjugate gaze, no eye redness/drainage  Neck:     Musculoskeletal: Neck supple.  Cardiovascular:     Rate and Rhythm: Normal rate.  Pulmonary:     Effort: No respiratory distress.  Abdominal:     General: There is no distension.  Musculoskeletal: Normal range of motion.     Comments: No leg swelling  Skin:    General: Skin is warm and dry.     Comments: No cyanosis  Neurological:     Mental Status: She is alert and oriented to person, place, and time.      UC Treatments / Results   Procedures Procedures (including critical care time)  Medications Ordered in UC Medications  cefTRIAXone (ROCEPHIN) injection 1 g (1 g Intramuscular Given 04/09/18 1758)  ketorolac (TORADOL) injection 60 mg (60 mg Intramuscular Given 04/09/18 1758)    Final Clinical Impressions(s) / UC Diagnoses   Final diagnoses:  Dental infection  Pain, dental     Discharge Instructions     Injection of ceftriaxone (antibiotic) and ketorolac (anti inflammatory/pain reliever) were given at the urgent care tonight.  Prescriptions for amoxicillin/clavulanate (antibiotic) and chlorhexidine mouth rinse (for bad breath due to dental infection) were sent to the pharmacy.  Anticipate gradual improvement in pain/swelling over the next couple days; pain and swelling may take several days to subside.  Go to ED if there is a marked increase in swelling/pain.  Keep dental  appointment as scheduled in a couple weeks.    ED Prescriptions    Medication Sig Dispense Auth. Provider   amoxicillin-clavulanate (AUGMENTIN) 875-125 MG tablet Take 1 tablet by mouth 2 (two) times daily  for 10 days. 20 tablet Isa RankinMurray, Vincie Linn Wilson, MD   chlorhexidine (PERIDEX) 0.12 % solution Use as directed 15 mLs in the mouth or throat 2 (two) times daily. Swish/spit 120 mL Isa RankinMurray, Jeena Arnett Wilson, MD        Isa RankinMurray, Eldredge Veldhuizen Wilson, MD 04/13/18 [phone]

## 2018-04-09 NOTE — Discharge Instructions (Signed)
Injection of ceftriaxone (antibiotic) and ketorolac (anti inflammatory/pain reliever) were given at the urgent care tonight.  Prescriptions for amoxicillin/clavulanate (antibiotic) and chlorhexidine mouth rinse (for bad breath due to dental infection) were sent to the pharmacy.  Anticipate gradual improvement in pain/swelling over the next couple days; pain and swelling may take several days to subside.  Go to ED if there is a marked increase in swelling/pain.  Keep dental appointment as scheduled in a couple weeks.

## 2018-07-26 ENCOUNTER — Telehealth (HOSPITAL_COMMUNITY): Payer: Self-pay | Admitting: Emergency Medicine

## 2018-07-26 ENCOUNTER — Ambulatory Visit (HOSPITAL_COMMUNITY)
Admission: EM | Admit: 2018-07-26 | Discharge: 2018-07-26 | Disposition: A | Discharge status: Home / Self Care | Payer: Commercial Managed Care - PPO | Attending: Family Medicine | Admitting: Family Medicine

## 2018-07-26 ENCOUNTER — Encounter (HOSPITAL_COMMUNITY): Payer: Self-pay | Admitting: Emergency Medicine

## 2018-07-26 ENCOUNTER — Other Ambulatory Visit: Payer: Self-pay

## 2018-07-26 DIAGNOSIS — R1032 Left lower quadrant pain: Secondary | ICD-10-CM | POA: Diagnosis not present

## 2018-07-26 DIAGNOSIS — N898 Other specified noninflammatory disorders of vagina: Secondary | ICD-10-CM | POA: Diagnosis present

## 2018-07-26 LAB — POCT URINALYSIS DIP (DEVICE)
BILIRUBIN URINE: NEGATIVE
Glucose, UA: NEGATIVE mg/dL
HGB URINE DIPSTICK: NEGATIVE
KETONES UR: NEGATIVE mg/dL
LEUKOCYTE UA: NEGATIVE
Nitrite: NEGATIVE
Protein, ur: NEGATIVE mg/dL
SPECIFIC GRAVITY, URINE: 1.025 (ref 1.005–1.030)
Urobilinogen, UA: 0.2 mg/dL (ref 0.0–1.0)
pH: 6 (ref 5.0–8.0)

## 2018-07-26 LAB — CBC
HEMATOCRIT: 41.5 % (ref 36.0–46.0)
Hemoglobin: 13.8 g/dL (ref 12.0–15.0)
MCH: 29.6 pg (ref 26.0–34.0)
MCHC: 33.3 g/dL (ref 30.0–36.0)
MCV: 88.9 fL (ref 80.0–100.0)
NRBC: 0 % (ref 0.0–0.2)
Platelets: 319 10*3/uL (ref 150–400)
RBC: 4.67 MIL/uL (ref 3.87–5.11)
RDW: 11.9 % (ref 11.5–15.5)
WBC: 5.3 10*3/uL (ref 4.0–10.5)

## 2018-07-26 LAB — POCT PREGNANCY, URINE: Preg Test, Ur: NEGATIVE

## 2018-07-26 MED ORDER — FLUCONAZOLE 150 MG PO TABS: 150.0000 mg | ORAL_TABLET | Freq: Once | ORAL | 0 refills | Status: AC

## 2018-07-26 NOTE — ED Notes (Signed)
Patient verbalizes understanding of discharge instructions. Opportunity for questioning and answers were provided. Patient discharged from [facility] by APP.  

## 2018-07-26 NOTE — Telephone Encounter (Signed)
Patient contacted and made aware of all results, all questions answered.   

## 2018-07-26 NOTE — Discharge Instructions (Signed)
Please take 1 tablet of Diflucan daily treat for yeast.  We will call with results of swab and provide any further treatment if needed.  If continuing to have lower abdominal discomfort please follow-up with OB/GYN for further evaluation.  We are testing you for Gonorrhea, Chlamydia, Trichomonas, Yeast and Bacterial Vaginosis. We will call you if anything is positive and let you know if you require any further treatment. Please inform partners of any positive results.   Please return if symptoms not improving with treatment, development of fever, nausea, vomiting, abdominal pain.

## 2018-07-26 NOTE — ED Triage Notes (Signed)
Pt sts left sided lower abd pain with vaginal discharge; pt sts her nail beds have been purple at times

## 2018-07-26 NOTE — ED Provider Notes (Signed)
[facility]    CSN: [id number] Arrival date & time: 07/26/18  1151     History   Chief Complaint Chief Complaint  Patient presents with  . Vaginal Discharge  . Abdominal Pain    HPI Diamond Rodriguez is a 28 y.o. female negative past medical history presenting today for evaluation of abdominal pain and vaginal discharge.  Patient states that over the past 2 months she has had occasional intermittent mild left lower abdominal pain.  She is also had discharge off and on with associated odor.  Does note that she has a history of BV, but odor slightly different.  Denies any itching or irritation.  Denies urinary symptoms of dysuria, increased frequency or hematuria.  Denies any rashes or lesions.  Denies history of ovarian cysts.  Denies nausea or vomiting.  Has been eating and drinking like normal.  Denies straining with bowel movements, and bowel movements are regular.  Patient also concerned that she has noticed her nailbeds turning purple occasionally.  Cannot correlate this with any specific trigger or cold environments.  Denies pain associated.  Denies associated numbness or tingling.  Denies difficulty moving fingers when this happens or surrounding skin changes.  HPI  Past Medical History:  Diagnosis Date  . Chlamydia   . UTI (lower urinary tract infection)     Patient Active Problem List   Diagnosis Date Noted  . Normal delivery 04/12/2011    Past Surgical History:  Procedure Laterality Date  . WISDOM TOOTH EXTRACTION      OB History    Gravida  1   Para  1   Term  1   Preterm      AB      Living  1     SAB      TAB      Ectopic      Multiple      Live Births  1            Home Medications    Prior to Admission medications   Medication Sig Start Date End Date Taking? Authorizing Provider  chlorhexidine (PERIDEX) 0.12 % solution Use as directed 15 mLs in the mouth or throat 2 (two) times daily. Swish/spit 04/09/18   Isa Rankin, MD  diphenhydrAMINE (BENADRYL) 25 MG tablet Take 1 tablet (25 mg total) by mouth every 8 (eight) hours as needed. 07/19/17   Roxy Horseman, PA-C  EPINEPHrine (EPIPEN 2-PAK) 0.3 mg/0.3 mL IJ SOAJ injection Inject 0.3 mLs (0.3 mg total) into the muscle once as needed for up to 1 dose (for severe allergic reaction). CAll 911 immediately if you have to use this medicine 07/19/17   Roxy Horseman, PA-C  etonogestrel (NEXPLANON) 68 MG IMPL implant 1 each by Subdermal route once.    [provider]  fluconazole (DIFLUCAN) 150 MG tablet Take 1 tablet (150 mg total) by mouth once for 1 dose. 07/26/18 07/26/18  ,  C, PA-C  ibuprofen (ADVIL,MOTRIN) 600 MG tablet Take 1 tablet (600 mg total) by mouth every 6 (six) hours as needed. 12/31/17   Dartha Lodge, PA-C  lidocaine (LIDODERM) 5 % Place 1 patch onto the skin daily. Remove & Discard patch within 12 hours or as directed by MD 01/05/18   Caccavale, Sophia, PA-C  methocarbamol (ROBAXIN) 500 MG tablet Take 1 tablet (500 mg total) by mouth 2 (two) times daily. Patient not taking: Reported on 07/26/2018 12/31/17   Dartha Lodge, PA-C  metroNIDAZOLE (FLAGYL)  500 MG tablet Take 1 tablet (500 mg total) by mouth 2 (two) times daily. Patient not taking: Reported on 07/26/2018 03/06/16   Hermina Staggers, MD  predniSONE (DELTASONE) 20 MG tablet Take 2 tablets (40 mg total) by mouth daily. Patient not taking: Reported on 07/26/2018 07/19/17   Roxy Horseman, PA-C  albuterol (VENTOLIN HFA) 108 (90 BASE) MCG/ACT inhaler Inhale 2 puffs into the lungs every 6 (six) hours as needed. Shortness of breath 04/13/11 07/07/11  Antionette Char, MD    Family History Family History  Problem Relation Age of Onset  . Diabetes Maternal Grandmother     Social History Social History   Tobacco Use  . Smoking status: Current Some Day Smoker    Packs/day: 0.25  . Smokeless tobacco: Never Used  . Tobacco comment: Black & Milds occasionally  Substance  Use Topics  . Alcohol use: No  . Drug use: No     Allergies   Banana   Review of Systems Review of Systems  Constitutional: Negative for fever.  Respiratory: Negative for shortness of breath.   Cardiovascular: Negative for chest pain.  Gastrointestinal: Positive for abdominal pain. Negative for diarrhea, nausea and vomiting.  Genitourinary: Positive for vaginal discharge. Negative for dysuria, flank pain, genital sores, hematuria, menstrual problem, vaginal bleeding and vaginal pain.  Musculoskeletal: Negative for back pain.  Skin: Positive for color change. Negative for rash.  Neurological: Negative for dizziness, light-headedness and headaches.     Physical Exam Triage Vital Signs ED Triage Vitals  Enc Vitals Group     BP 07/26/18 1208 137/84     Pulse Rate 07/26/18 1208 79     Resp 07/26/18 1208 18     Temp 07/26/18 1208 98.9 F (37.2 C)     Temp Source 07/26/18 1208 Oral     SpO2 07/26/18 1208 99 %     Weight --      Height --      Head Circumference --      Peak Flow --      Pain Score 07/26/18 1209 3     Pain Loc --      Pain Edu? --      Excl. in GC? --    No data found.  Updated Vital Signs BP 137/84 (BP Location: Right Arm)   Pulse 79   Temp 98.9 F (37.2 C) (Oral)   Resp 18   SpO2 99%   Visual Acuity Right Eye Distance:   Left Eye Distance:   Bilateral Distance:    Right Eye Near:   Left Eye Near:    Bilateral Near:     Physical Exam Vitals signs and nursing note reviewed.  Constitutional:      General: She is not in acute distress.    Appearance: She is well-developed.  HENT:     Head: Normocephalic and atraumatic.  Eyes:     Conjunctiva/sclera: Conjunctivae normal.  Neck:     Musculoskeletal: Neck supple.  Cardiovascular:     Rate and Rhythm: Normal rate and regular rhythm.     Heart sounds: No murmur.  Pulmonary:     Effort: Pulmonary effort is normal. No respiratory distress.     Breath sounds: Normal breath sounds.   Abdominal:     Palpations: Abdomen is soft.     Tenderness: There is no abdominal tenderness.     Comments: Nontender to light and deep palpation throughout all 4 quadrants of abdomen and suprapubic area  Genitourinary:  Comments: Normal external female genitalia, vaginal vault with moderate amount of white thicker discharge, no cervical erythema No adnexal masses palpated Musculoskeletal:     Comments: Full active range of motion of finger on bilateral hands  Skin:    General: Skin is warm and dry.     Capillary Refill: Capillary refill takes less than 2 seconds.     Comments: Nailbeds pink, cap refill less than 2 seconds, surrounding skin with normal coloration  Neurological:     Mental Status: She is alert.      UC Treatments / Results  Labs (all labs ordered are listed, but only abnormal results are displayed) Labs Reviewed  CBC  POC URINE PREG, ED  POCT URINALYSIS DIP (DEVICE)  POCT PREGNANCY, URINE  CERVICOVAGINAL ANCILLARY ONLY    EKG None  Radiology No results found.  Procedures Procedures (including critical care time)  Medications Ordered in UC Medications - No data to display  Initial Impression / Assessment and Plan / UC Course  I have reviewed the triage vital signs and the nursing notes.  Pertinent labs & imaging results that were available during my care of the patient were reviewed by me and considered in my medical decision making (see chart for details).     Vaginal swab obtained and will send off to check for causes of discharge/abdominal pain.  Will empirically treat for yeast based off nature of discharge seen on exam.  I recommend following up with OB/GYN for further evaluation of possible cysts or other pelvic causes causing recurrent lower abdominal pain.  Symptoms seem less GI related as tolerating normal oral intake with normal bowel movements.  Cap refill brisk, no pain, oxygen 100%.  Do not suspect underlying abnormality related to  occasional purple discoloration.  Symptoms do not seem suggestive of raynauds.  Will check CBC to ensure patient not anemic.  Will call patient with results if abnormal.  Continue to monitor,Discussed strict return precautions. Patient verbalized understanding and is agreeable with plan.  Final Clinical Impressions(s) / UC Diagnoses   Final diagnoses:  Vaginal discharge  Left lower quadrant abdominal pain     Discharge Instructions     Please take 1 tablet of Diflucan daily treat for yeast.  We will call with results of swab and provide any further treatment if needed.  If continuing to have lower abdominal discomfort please follow-up with OB/GYN for further evaluation.  We are testing you for Gonorrhea, Chlamydia, Trichomonas, Yeast and Bacterial Vaginosis. We will call you if anything is positive and let you know if you require any further treatment. Please inform partners of any positive results.   Please return if symptoms not improving with treatment, development of fever, nausea, vomiting, abdominal pain.     ED Prescriptions    Medication Sig Dispense Auth. Provider   fluconazole (DIFLUCAN) 150 MG tablet Take 1 tablet (150 mg total) by mouth once for 1 dose. 2 tablet ,  C, PA-C     Controlled Substance Prescriptions [place] Controlled Substance Registry consulted? Not Applicable   Lew Dawes, [place] 07/26/18 1314

## 2018-07-27 LAB — CERVICOVAGINAL ANCILLARY ONLY
Bacterial vaginitis: POSITIVE — AB
CHLAMYDIA, DNA PROBE: NEGATIVE
Candida vaginitis: NEGATIVE
Neisseria Gonorrhea: NEGATIVE
Trichomonas: NEGATIVE

## 2018-07-29 ENCOUNTER — Telehealth (HOSPITAL_COMMUNITY): Payer: Self-pay | Admitting: Emergency Medicine

## 2018-07-29 NOTE — Telephone Encounter (Signed)
Bacterial vaginosis is positive. This was not treated at the urgent care visit.  Flagyl 500 mg BID x 7 days #14 no refills sent to patients pharmacy of choice.    Attempted to reach patient. No answer at this time. Voicemail left.    

## 2018-07-30 ENCOUNTER — Telehealth (HOSPITAL_COMMUNITY): Payer: Self-pay | Admitting: Emergency Medicine

## 2018-07-30 MED ORDER — METRONIDAZOLE 500 MG PO TABS: 500.0000 mg | ORAL_TABLET | Freq: Two times a day (BID) | ORAL | 0 refills | Status: AC

## 2018-07-30 NOTE — Telephone Encounter (Signed)
Patient contacted and made aware of all results, all questions answered.   

## 2018-07-30 NOTE — Telephone Encounter (Signed)
Attempted to reach patient x2. No answer at this time. Voicemail left.    

## 2018-11-03 ENCOUNTER — Ambulatory Visit (HOSPITAL_COMMUNITY)
Admission: EM | Admit: 2018-11-03 | Discharge: 2018-11-03 | Disposition: A | Discharge status: Home / Self Care | Payer: Commercial Managed Care - PPO | Attending: Emergency Medicine | Admitting: Emergency Medicine

## 2018-11-03 ENCOUNTER — Encounter (HOSPITAL_COMMUNITY): Payer: Self-pay | Admitting: Emergency Medicine

## 2018-11-03 ENCOUNTER — Other Ambulatory Visit: Payer: Self-pay

## 2018-11-03 DIAGNOSIS — R51 Headache: Secondary | ICD-10-CM

## 2018-11-03 DIAGNOSIS — Z20822 Contact with and (suspected) exposure to covid-19: Secondary | ICD-10-CM

## 2018-11-03 DIAGNOSIS — J302 Other seasonal allergic rhinitis: Secondary | ICD-10-CM | POA: Diagnosis not present

## 2018-11-03 DIAGNOSIS — H60312 Diffuse otitis externa, left ear: Secondary | ICD-10-CM | POA: Diagnosis not present

## 2018-11-03 DIAGNOSIS — Z20828 Contact with and (suspected) exposure to other viral communicable diseases: Secondary | ICD-10-CM

## 2018-11-03 DIAGNOSIS — R197 Diarrhea, unspecified: Secondary | ICD-10-CM

## 2018-11-03 MED ORDER — CIPRODEX 0.3-0.1 % OT SUSP: 4.0000 [drp] | Freq: Two times a day (BID) | OTIC | 0 refills | Status: DC

## 2018-11-03 MED ORDER — FLUTICASONE PROPIONATE 50 MCG/ACT NA SUSP: 1.0000 | Freq: Every day | NASAL | 0 refills | Status: DC

## 2018-11-03 NOTE — ED Triage Notes (Signed)
3 day history of headache, runny nose, wheezing.   No cough No fever

## 2018-11-03 NOTE — Discharge Instructions (Addendum)
[facility] testing center will call you to schedule this appointment. Important to stay home/isolate in the interim. You may take Tylenol for fever, muscle aches. Important to drink plenty of water throughout the day (8 cups minimum his goal).

## 2018-11-03 NOTE — ED Provider Notes (Signed)
[facility]    CSN: [id number] Arrival date & time: 11/03/18  1258     History   Chief Complaint Chief Complaint  Patient presents with  . URI    HPI Diamond Rodriguez is a 28 y.o. female resenting for acute concern of headache, runny nose, sneezing.  She states she is had symptoms for the last 3 days.  Endorsing rhinorrhea, frontal headache, sinus pressure.  Patient does also endorse left-sided ear pain.  States that she went swimming in a friend's pool about a week ago.  Tends to get swimmer's ear.  Has elevated temperature today, though patient denies feeling febrile.  No decreased appetite, change in smell/taste, myalgias, arthralgias, sore throat, odynophagia, cough, chest pain, shortness of breath, abdominal pain.  Patient did have single episode of diarrhea a few days ago, though this was self-limited and without melena or blood.  Patient does not currently work, though is interested in COVID testing due to her constellation of symptoms.  Does endorse history of seasonal allergies.  Has not tried anything for her symptoms.   Past Medical History:  Diagnosis Date  . Chlamydia   . UTI (lower urinary tract infection)     Patient Active Problem List   Diagnosis Date Noted  . Normal delivery 04/12/2011    Past Surgical History:  Procedure Laterality Date  . WISDOM TOOTH EXTRACTION      OB History    Gravida  1   Para  1   Term  1   Preterm      AB      Living  1     SAB      TAB      Ectopic      Multiple      Live Births  1            Home Medications    Prior to Admission medications   Medication Sig Start Date End Date Taking? Authorizing Provider  ciprofloxacin-dexamethasone (CIPRODEX) OTIC suspension Place 4 drops into the left ear 2 (two) times daily. 11/03/18   Hall-Potvin, [place], PA-C  EPINEPHrine (EPIPEN 2-PAK) 0.3 mg/0.3 mL IJ SOAJ injection Inject 0.3 mLs (0.3 mg total) into the muscle once as needed for up to 1 dose (for  severe allergic reaction). CAll 911 immediately if you have to use this medicine 07/19/17   Roxy HorsemanBrowning, Robert, PA-C  etonogestrel (NEXPLANON) 68 MG IMPL implant 1 each by Subdermal route once.    [provider]  fluticasone (FLONASE) 50 MCG/ACT nasal spray Place 1 spray into both nostrils daily. 11/03/18   Hall-Potvin, [place], PA-C  ibuprofen (ADVIL,MOTRIN) 600 MG tablet Take 1 tablet (600 mg total) by mouth every 6 (six) hours as needed. 12/31/17   Dartha LodgeFord, Kelsey N, PA-C  albuterol (VENTOLIN HFA) 108 (90 BASE) MCG/ACT inhaler Inhale 2 puffs into the lungs every 6 (six) hours as needed. Shortness of breath 04/13/11 07/07/11  Antionette CharJackson-Moore, Lisa, MD  diphenhydrAMINE (BENADRYL) 25 MG tablet Take 1 tablet (25 mg total) by mouth every 8 (eight) hours as needed. 07/19/17 11/03/18  Roxy HorsemanBrowning, Robert, PA-C    Family History Family History  Problem Relation Age of Onset  . Diabetes Maternal Grandmother     Social History Social History   Tobacco Use  . Smoking status: Current Some Day Smoker    Packs/day: 0.25  . Smokeless tobacco: Never Used  . Tobacco comment: Black & Milds occasionally  Substance Use Topics  . Alcohol use: No  .  Drug use: No     Allergies   Banana   Review of Systems Review of Systems  Constitutional: Negative for appetite change, fatigue and fever.  HENT: Positive for ear pain, rhinorrhea, sinus pressure, sinus pain and sneezing. Negative for nosebleeds, postnasal drip, sore throat and trouble swallowing.   Respiratory: Negative for cough, shortness of breath and wheezing.   Cardiovascular: Negative for chest pain and palpitations.  Gastrointestinal: Positive for diarrhea. Negative for abdominal pain, nausea and vomiting.     Physical Exam Triage Vital Signs ED Triage Vitals  Enc Vitals Group     BP 11/03/18 1327 110/83     Pulse Rate 11/03/18 1327 87     Resp 11/03/18 1327 18     Temp 11/03/18 1327 99.7 F (37.6 C)     Temp Source 11/03/18 1327 Oral      SpO2 11/03/18 1327 100 %     Weight --      Height --      Head Circumference --      Peak Flow --      Pain Score 11/03/18 1323 1     Pain Loc --      Pain Edu? --      Excl. in GC? --    No data found.  Updated Vital Signs BP 110/83   Pulse 87   Temp 99.7 F (37.6 C) (Oral)   Resp 18   LMP 10/27/2018   SpO2 100%   Visual Acuity Right Eye Distance:   Left Eye Distance:   Bilateral Distance:    Right Eye Near:   Left Eye Near:    Bilateral Near:     Physical Exam Vitals signs and nursing note reviewed.  Constitutional:      General: She is not in acute distress. HENT:     Head: Normocephalic and atraumatic.     Right Ear: Tympanic membrane, ear canal and external ear normal. There is no impacted cerumen.     Left Ear: There is no impacted cerumen.     Ears:     Comments: Mild tragal tenderness of left ear.  EAC is diffusely edematous and erythematous.  Trace discharge noted.  TM intact, no blood.    Nose:     Comments: Significant bilateral turbinate edema with scant mucoid discharge.  Mucosal lining is pink.    Mouth/Throat:     Mouth: Mucous membranes are moist.     Pharynx: Oropharynx is clear. No oropharyngeal exudate or posterior oropharyngeal erythema.  Eyes:     General: No scleral icterus.    Conjunctiva/sclera: Conjunctivae normal.     Pupils: Pupils are equal, round, and reactive to light.  Neck:     Musculoskeletal: Normal range of motion. No muscular tenderness.  Cardiovascular:     Rate and Rhythm: Normal rate and regular rhythm.  Pulmonary:     Effort: Pulmonary effort is normal.     Breath sounds: Normal breath sounds.  Abdominal:     General: Abdomen is flat. Bowel sounds are normal.     Palpations: Abdomen is soft.     Tenderness: There is no abdominal tenderness. There is no right CVA tenderness or left CVA tenderness.  Lymphadenopathy:     Cervical: No cervical adenopathy.  Skin:    General: Skin is warm.     Capillary Refill:  Capillary refill takes less than 2 seconds.     Coloration: Skin is not jaundiced or pale.  Neurological:  Mental Status: She is alert and oriented to person, place, and time.      UC Treatments / Results  Labs (all labs ordered are listed, but only abnormal results are displayed) Labs Reviewed - No data to display  EKG   Radiology No results found.  Procedures Procedures (including critical care time)  Medications Ordered in UC Medications - No data to display  Initial Impression / Assessment and Plan / UC Course  I have reviewed the triage vital signs and the nursing notes.  Pertinent labs & imaging results that were available during my care of the patient were reviewed by me and considered in my medical decision making (see chart for details).     28 year old female with history of seasonal allergies presenting for URI symptoms.  Left ear endings consistent with acute otitis externa, will treat with Ciprodex drops as TM is intact.  Patient denies cough, shortness of breath.  Given constellation of symptoms, okay with COVID testing.  Patient to follow-up with testing facility regarding this.  Strict return precautions given, patient verbalized understanding of supportive measures as listed below.  Final Clinical Impressions(s) / UC Diagnoses   Final diagnoses:  Acute diffuse otitis externa of left ear  Seasonal allergies  Suspected Covid-19 Virus Infection     Discharge Instructions     [facility] testing center will call you to schedule this appointment. Important to stay home/isolate in the interim. You may take Tylenol for fever, muscle aches. Important to drink plenty of water throughout the day (8 cups minimum his goal).    ED Prescriptions    Medication Sig Dispense Auth. Provider   fluticasone (FLONASE) 50 MCG/ACT nasal spray Place 1 spray into both nostrils daily. 16 g Hall-Potvin, [place], PA-C   ciprofloxacin-dexamethasone (CIPRODEX) OTIC suspension  Place 4 drops into the left ear 2 (two) times daily. 7.5 mL Hall-Potvin, [place], PA-C     Controlled Substance Prescriptions [place] Controlled Substance Registry consulted? Not Applicable   Quincy Sheehan, [place] 11/03/18 1428

## 2018-11-04 ENCOUNTER — Other Ambulatory Visit: Payer: Self-pay

## 2018-11-04 ENCOUNTER — Telehealth: Payer: Self-pay | Admitting: *Deleted

## 2018-11-04 DIAGNOSIS — Z20822 Contact with and (suspected) exposure to covid-19: Secondary | ICD-10-CM

## 2018-11-04 NOTE — Telephone Encounter (Signed)
-----   Message from [place], [place] sent at 11/03/2018  2:06 PM EDT ----- Regarding: needs covid testing URI sx w/ diarrhea, elevated temp.

## 2018-11-04 NOTE — Telephone Encounter (Signed)
Scheduled patient for COVID 19 test today at 2:30 pm at [facility].  Testing protocol reviewed with patient.

## 2018-11-08 LAB — NOVEL CORONAVIRUS, NAA: SARS-CoV-2, NAA: DETECTED — AB

## 2018-11-09 ENCOUNTER — Encounter (HOSPITAL_COMMUNITY): Payer: Self-pay

## 2018-11-09 ENCOUNTER — Telehealth (HOSPITAL_COMMUNITY): Payer: Self-pay | Admitting: Emergency Medicine

## 2018-11-09 NOTE — Telephone Encounter (Signed)
Patient contacted and made aware of all results, all questions answered.   

## 2018-11-09 NOTE — Telephone Encounter (Signed)
Your test for COVID-19 was positive, meaning that you were infected with the novel coronavirus and could give the germ to others.  Please continue isolation at home, for at least 10 days since the start of your fever/cough/breathlessness and until you have had 3 consecutive days without fever (without taking a fever reducer) and with cough/breathlessness improving. Please continue good preventive care measures, including:  frequent hand-washing, avoid touching your face, cover coughs/sneezes, stay out of crowds and keep a 6 foot distance from others.  Recheck or go to the nearest hospital ED tent for re-assessment if fever/cough/breathlessness return.  Attempted to reach patient. No answer at this time. Voicemail left.   Mychart note sent.
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
## 2019-06-20 ENCOUNTER — Ambulatory Visit (HOSPITAL_COMMUNITY)
Admission: EM | Admit: 2019-06-20 | Discharge: 2019-06-20 | Disposition: A | Discharge status: Home / Self Care | Payer: Medicaid Other | Attending: Family Medicine | Admitting: Family Medicine

## 2019-06-20 ENCOUNTER — Encounter (HOSPITAL_COMMUNITY): Payer: Self-pay

## 2019-06-20 ENCOUNTER — Other Ambulatory Visit: Payer: Self-pay

## 2019-06-20 DIAGNOSIS — Z20822 Contact with and (suspected) exposure to covid-19: Secondary | ICD-10-CM | POA: Insufficient documentation

## 2019-06-20 DIAGNOSIS — R112 Nausea with vomiting, unspecified: Secondary | ICD-10-CM

## 2019-06-20 DIAGNOSIS — Z79899 Other long term (current) drug therapy: Secondary | ICD-10-CM | POA: Insufficient documentation

## 2019-06-20 DIAGNOSIS — R109 Unspecified abdominal pain: Secondary | ICD-10-CM | POA: Insufficient documentation

## 2019-06-20 DIAGNOSIS — F17210 Nicotine dependence, cigarettes, uncomplicated: Secondary | ICD-10-CM | POA: Diagnosis not present

## 2019-06-20 DIAGNOSIS — R197 Diarrhea, unspecified: Secondary | ICD-10-CM | POA: Insufficient documentation

## 2019-06-20 LAB — POC SARS CORONAVIRUS 2 AG: SARS Coronavirus 2 Ag: NEGATIVE

## 2019-06-20 LAB — POC SARS CORONAVIRUS 2 AG -  ED: SARS Coronavirus 2 Ag: NEGATIVE

## 2019-06-20 MED ORDER — ONDANSETRON 4 MG PO TBDP
8.0000 mg | ORAL_TABLET | Freq: Once | ORAL | Status: AC
  Administered 2019-06-20: 8 mg via ORAL

## 2019-06-20 MED ORDER — ONDANSETRON HCL 8 MG PO TABS: 8.0000 mg | ORAL_TABLET | Freq: Three times a day (TID) | ORAL | 0 refills | Status: DC | PRN

## 2019-06-20 MED ORDER — ONDANSETRON 4 MG PO TBDP
ORAL_TABLET | ORAL | Status: AC
  Filled 2019-06-20: qty 2

## 2019-06-20 NOTE — ED Triage Notes (Signed)
Pt present vomiting, diarrhea and nausea. Symptoms started yesterday after she ate [employer].

## 2019-06-20 NOTE — Discharge Instructions (Addendum)
Drink plenty of clear liquids Take Zofran as needed for nausea or vomiting Bland diet May use imodium or pepto bismol for the diarrhea rest

## 2019-06-20 NOTE — ED Provider Notes (Addendum)
[place]    CSN: [id number] Arrival date & time: 06/20/19  1721      History   Chief Complaint Chief Complaint  Patient presents with  . Emesis  . Nausea  . Diarrhea  . Abdominal Pain    HPI Diamond Rodriguez is a 29 y.o. female.   HPI   patient is here for nausea vomiting and diarrhea.  The started last night.  She states she felt sort of sick yesterday evening and then 1 intended her Janine Limbo.  After Janine Limbo she started vomiting.  She has been vomiting ever since that time.  No fever or chills.  No body aches.  She had Covid in July 2020.  Is certain that she does not have it again.  Her daughter is sick also with abdominal cramping and headache. ( No vomiting.  No diarrhea.)  Past Medical History:  Diagnosis Date  . Chlamydia   . UTI (lower urinary tract infection)     Patient Active Problem List   Diagnosis Date Noted  . Normal delivery 04/12/2011    Past Surgical History:  Procedure Laterality Date  . WISDOM TOOTH EXTRACTION      OB History    Gravida  1   Para  1   Term  1   Preterm      AB      Living  1     SAB      TAB      Ectopic      Multiple      Live Births  1            Home Medications    Prior to Admission medications   Medication Sig Start Date End Date Taking? Authorizing Provider  etonogestrel (NEXPLANON) 68 MG IMPL implant 1 each by Subdermal route once.    [provider]  ondansetron (ZOFRAN) 8 MG tablet Take 1 tablet (8 mg total) by mouth every 8 (eight) hours as needed for nausea or vomiting. 06/20/19   Raylene Everts, MD  albuterol (VENTOLIN HFA) 108 (90 BASE) MCG/ACT inhaler Inhale 2 puffs into the lungs every 6 (six) hours as needed. Shortness of breath 04/13/11 07/07/11  Lahoma Crocker, MD  diphenhydrAMINE (BENADRYL) 25 MG tablet Take 1 tablet (25 mg total) by mouth every 8 (eight) hours as needed. 07/19/17 11/03/18  Montine Circle, PA-C  fluticasone (FLONASE) 50 MCG/ACT nasal  spray Place 1 spray into both nostrils daily. 11/03/18 06/20/19  Hall-Potvin, [place], PA-C    Family History Family History  Problem Relation Age of Onset  . Diabetes Maternal Grandmother     Social History Social History   Tobacco Use  . Smoking status: Current Some Day Smoker    Packs/day: 0.25  . Smokeless tobacco: Never Used  . Tobacco comment: Black & Milds occasionally  Substance Use Topics  . Alcohol use: No  . Drug use: No     Allergies   Banana   Review of Systems Review of Systems  Constitutional: Positive for fatigue.  Gastrointestinal: Positive for diarrhea, nausea and vomiting.     Physical Exam Triage Vital Signs ED Triage Vitals  Enc Vitals Group     BP 06/20/19 1806 95/63     Pulse Rate 06/20/19 1806 100     Resp 06/20/19 1806 (!) 22     Temp 06/20/19 1806 100 F (37.8 C)     Temp Source 06/20/19 1806 Oral     SpO2 06/20/19 1806  100 %     Weight --      Height --      Head Circumference --      Peak Flow --      Pain Score 06/20/19 1803 10     Pain Loc --      Pain Edu? --      Excl. in GC? --    No data found.  Updated Vital Signs BP 95/63 (BP Location: Left Arm)   Pulse 100   Temp 100 F (37.8 C) (Oral)   Resp (!) 22   SpO2 100%      Physical Exam Constitutional:      General: She is not in acute distress.    Appearance: She is well-developed and normal weight. She is ill-appearing.  HENT:     Head: Normocephalic and atraumatic.     Mouth/Throat:     Comments: Mask in place.  Mucous membranes moist Eyes:     Conjunctiva/sclera: Conjunctivae normal.     Pupils: Pupils are equal, round, and reactive to light.  Cardiovascular:     Rate and Rhythm: Normal rate and regular rhythm.     Heart sounds: Normal heart sounds.  Pulmonary:     Effort: Pulmonary effort is normal. No respiratory distress.     Breath sounds: Normal breath sounds.  Abdominal:     General: Bowel sounds are normal. There is no distension.      Palpations: Abdomen is soft.     Comments: Active bowel sounds.  Very mild generalized tenderness  Musculoskeletal:        General: Normal range of motion.     Cervical back: Normal range of motion.  Skin:    General: Skin is warm and dry.  Neurological:     Mental Status: She is alert.  Psychiatric:        Mood and Affect: Mood normal.        Behavior: Behavior normal.      UC Treatments / Results  Labs (all labs ordered are listed, but only abnormal results are displayed) Labs Reviewed  NOVEL CORONAVIRUS, NAA (HOSP ORDER, SEND-OUT TO REF LAB; TAT 18-24 HRS)  POC SARS CORONAVIRUS 2 AG -  ED  POC SARS CORONAVIRUS 2 AG    EKG   Radiology No results found.  Procedures Procedures (including critical care time)  Medications Ordered in UC Medications  ondansetron (ZOFRAN-ODT) disintegrating tablet 8 mg (8 mg Oral Given 06/20/19 1823)    Initial Impression / Assessment and Plan / UC Course  I have reviewed the triage vital signs and the nursing notes.  Pertinent labs & imaging results that were available during my care of the patient were reviewed by me and considered in my medical decision making (see chart for details).     Patient likely has a viral gastroenteritis.  I think unrelated to eating at fast food. Final Clinical Impressions(s) / UC Diagnoses   Final diagnoses:  Nausea vomiting and diarrhea     Discharge Instructions     Drink plenty of clear liquids Take Zofran as needed for nausea or vomiting Bland diet May use imodium or pepto bismol for the diarrhea rest    ED Prescriptions    Medication Sig Dispense Auth. Provider   ondansetron (ZOFRAN) 8 MG tablet Take 1 tablet (8 mg total) by mouth every 8 (eight) hours as needed for nausea or vomiting. 20 tablet Eustace Moore, MD     PDMP not reviewed this encounter.  Eustace Moore, MD 06/20/19 [place]ell Level    Eustace Moore, MD 06/20/19 2030

## 2019-06-22 LAB — NOVEL CORONAVIRUS, NAA (HOSP ORDER, SEND-OUT TO REF LAB; TAT 18-24 HRS): SARS-CoV-2, NAA: NOT DETECTED

## 2020-01-16 ENCOUNTER — Ambulatory Visit (HOSPITAL_COMMUNITY)
Admission: EM | Admit: 2020-01-16 | Discharge: 2020-01-16 | Disposition: A | Discharge status: Home / Self Care | Payer: Medicaid Other | Attending: Family Medicine | Admitting: Family Medicine

## 2020-01-16 ENCOUNTER — Encounter (HOSPITAL_COMMUNITY): Payer: Self-pay

## 2020-01-16 ENCOUNTER — Other Ambulatory Visit: Payer: Self-pay

## 2020-01-16 DIAGNOSIS — Z20822 Contact with and (suspected) exposure to covid-19: Secondary | ICD-10-CM | POA: Diagnosis not present

## 2020-01-16 DIAGNOSIS — Z87891 Personal history of nicotine dependence: Secondary | ICD-10-CM | POA: Insufficient documentation

## 2020-01-16 DIAGNOSIS — H9203 Otalgia, bilateral: Secondary | ICD-10-CM | POA: Diagnosis present

## 2020-01-16 DIAGNOSIS — J069 Acute upper respiratory infection, unspecified: Secondary | ICD-10-CM | POA: Insufficient documentation

## 2020-01-16 DIAGNOSIS — Z79899 Other long term (current) drug therapy: Secondary | ICD-10-CM | POA: Insufficient documentation

## 2020-01-16 MED ORDER — PSEUDOEPH-BROMPHEN-DM 30-2-10 MG/5ML PO SYRP: 5.0000 mL | ORAL_SOLUTION | Freq: Four times a day (QID) | ORAL | 0 refills | Status: DC | PRN

## 2020-01-16 MED ORDER — LEVOCETIRIZINE DIHYDROCHLORIDE 5 MG PO TABS: 5.0000 mg | ORAL_TABLET | Freq: Every evening | ORAL | 0 refills | Status: DC

## 2020-01-16 MED ORDER — IPRATROPIUM BROMIDE 0.03 % NA SOLN: 2.0000 | Freq: Two times a day (BID) | NASAL | 0 refills | Status: DC

## 2020-01-16 NOTE — ED Provider Notes (Signed)
[facility]    CSN: [id number] Arrival date & time: 01/16/20  1300      History   Chief Complaint Chief Complaint  Patient presents with  . Sore Throat    HPI Diamond Rodriguez is a 29 y.o. female.   HPI  Exposure to COVID-19 positive person x 7 days . URI symptoms started 5 days ago.  Continues to have mild residual sore throat, bilateral ear pain, and nasal congestion.  She is not taking medication for symptoms.  Would like to be Covid tested.  She is afebrile.  Denies any difficulty breathing, wheezing or shortness of breath.   Past Medical History:  Diagnosis Date  . Chlamydia   . UTI (lower urinary tract infection)     Patient Active Problem List   Diagnosis Date Noted  . Normal delivery 04/12/2011    Past Surgical History:  Procedure Laterality Date  . WISDOM TOOTH EXTRACTION      OB History    Gravida  1   Para  1   Term  1   Preterm      AB      Living  1     SAB      TAB      Ectopic      Multiple      Live Births  1            Home Medications    Prior to Admission medications   Medication Sig Start Date End Date Taking? Authorizing Provider  etonogestrel (NEXPLANON) 68 MG IMPL implant 1 each by Subdermal route once.    [provider]  ondansetron (ZOFRAN) 8 MG tablet Take 1 tablet (8 mg total) by mouth every 8 (eight) hours as needed for nausea or vomiting. 06/20/19   Eustace Moore, MD  albuterol (VENTOLIN HFA) 108 (90 BASE) MCG/ACT inhaler Inhale 2 puffs into the lungs every 6 (six) hours as needed. Shortness of breath 04/13/11 07/07/11  Antionette Char, MD  diphenhydrAMINE (BENADRYL) 25 MG tablet Take 1 tablet (25 mg total) by mouth every 8 (eight) hours as needed. 07/19/17 11/03/18  Roxy Horseman, PA-C  fluticasone (FLONASE) 50 MCG/ACT nasal spray Place 1 spray into both nostrils daily. 11/03/18 06/20/19  Hall-Potvin, [place], PA-C    Family History Family History  Problem Relation Age of Onset    . Diabetes Maternal Grandmother     Social History Social History   Tobacco Use  . Smoking status: Former Smoker    Packs/day: 0.25    Types: Cigars  . Smokeless tobacco: Never Used  . Tobacco comment: Black & Milds occasionally  Substance Use Topics  . Alcohol use: No  . Drug use: No     Allergies   Banana   Review of Systems Review of Systems Pertinent negatives listed in HPI Physical Exam Triage Vital Signs ED Triage Vitals  Enc Vitals Group     BP 01/16/20 1456 121/72     Pulse Rate 01/16/20 1456 77     Resp 01/16/20 1456 16     Temp 01/16/20 1456 98.2 F (36.8 C)     Temp Source 01/16/20 1456 Oral     SpO2 01/16/20 1456 100 %     Weight 01/16/20 1457 160 lb (72.6 kg)     Height 01/16/20 1457 5\' 3"  (1.6 m)     Head Circumference --      Peak Flow --      Pain Score 01/16/20 1457 8  Pain Loc --      Pain Edu? --      Excl. in GC? --    No data found.  Updated Vital Signs BP 121/72   Pulse 77   Temp 98.2 F (36.8 C) (Oral)   Resp 16   Ht 5\' 3"  (1.6 m)   Wt 160 lb (72.6 kg)   SpO2 100%   BMI 28.34 kg/m   Visual Acuity Right Eye Distance:   Left Eye Distance:   Bilateral Distance:    Right Eye Near:   Left Eye Near:    Bilateral Near:     Physical Exam General appearance: alert, well developed, well nourished, cooperative and in no distress ENT: Head: Normocephalic, without obvious abnormality, atraumatic Respiratory: Respirations even and unlabored, normal respiratory rate Heart: rate and rhythm normal. No gallop or murmurs noted on exam  Skin: Skin color, texture, turgor normal. No rashes seen  Psych: Appropriate mood and affect. Neurologic: Mental status: Alert, oriented to person, place, and time, thought content appropriate.  UC Treatments / Results  Labs (all labs ordered are listed, but only abnormal results are displayed) Labs Reviewed  SARS CORONAVIRUS 2 (TAT 6-24 HRS)    EKG   Radiology No results  found.  Procedures Procedures (including critical care time)  Medications Ordered in UC Medications - No data to display  Initial Impression / Assessment and Plan / UC Course  I have reviewed the triage vital signs and the nursing notes.  Pertinent labs & imaging results that were available during my care of the patient were reviewed by me and considered in my medical decision making (see chart for details).    COVID-19 test pending treating patient for viral illness therefore no antibiotics are indicated for management of viruses.  Symptom treatment indicated see orders below.  Patient advised if any symptoms worsen or she develops any difficulty breathing to go immediately to the emergency room or follow-up here during regular business hours. Final Clinical Impressions(s) / UC Diagnoses   Final diagnoses:  Viral URI with cough  Otalgia of both ears     Discharge Instructions     Your COVID 19 results will be available in 24 hours. Negative results are immediately resulted to Mychart. Positive results will receive a follow-up call from our clinic. If symptoms are present, I recommend home quarantine until results are known.     ED Prescriptions    Medication Sig Dispense Auth. Provider   ipratropium (ATROVENT) 0.03 % nasal spray Place 2 sprays into both nostrils 2 (two) times daily. 30 mL , FNP   levocetirizine (XYZAL) 5 MG tablet Take 1 tablet (5 mg total) by mouth every evening. 30 tablet Bing Neighbors, FNP   brompheniramine-pseudoephedrine-DM 30-2-10 MG/5ML syrup Take 5 mLs by mouth 4 (four) times daily as needed. 140 mL 04-24-1972, FNP     PDMP not reviewed this encounter.   Valari, [place], FNP 01/20/20 [phone]

## 2020-01-16 NOTE — Discharge Instructions (Signed)
Your COVID 19 results will be available in 24 hours. Negative results are immediately resulted to Mychart. Positive results will receive a follow-up call from our clinic. If symptoms are present, I recommend home quarantine until results are known.  

## 2020-01-16 NOTE — ED Triage Notes (Signed)
Pt c/o sore throat, dysphagia and right ear painx5 days. Pt states feels like both ears have fluid in them. PT states she was exposed to COVID + family member.

## 2020-01-17 LAB — SARS CORONAVIRUS 2 (TAT 6-24 HRS): SARS Coronavirus 2: NEGATIVE

## 2020-10-25 ENCOUNTER — Ambulatory Visit (HOSPITAL_COMMUNITY)
Admission: EM | Admit: 2020-10-25 | Discharge: 2020-10-25 | Disposition: A | Discharge status: Home / Self Care | Payer: Medicaid Other

## 2020-10-25 ENCOUNTER — Encounter (HOSPITAL_COMMUNITY): Payer: Self-pay

## 2020-10-25 ENCOUNTER — Other Ambulatory Visit: Payer: Self-pay

## 2020-10-25 DIAGNOSIS — H539 Unspecified visual disturbance: Secondary | ICD-10-CM

## 2020-10-25 NOTE — ED Triage Notes (Signed)
Pt reports right eye watering for 2 days with eye swelling causing blurriness in vision this morning.

## 2020-10-25 NOTE — ED Provider Notes (Signed)
[facility]    CSN: [id number] Arrival date & time: 10/25/20  1246      History   Chief Complaint Chief Complaint  Patient presents with   Facial Swelling    HPI Diamond Rodriguez is a 30 y.o. female.   Patient here c/w R eye swelling and clear discharge x 2 days.  Admits red eye, photophobia, blurry vision.  Denies eye pain.  Denies f/c, tenderness, vision loss.  No injury or trauma to eye.  She is using allergic eye drops w/o relief.   Past Medical History:  Diagnosis Date   Chlamydia    UTI (lower urinary tract infection)     Patient Active Problem List   Diagnosis Date Noted   Normal delivery 04/12/2011    Past Surgical History:  Procedure Laterality Date   WISDOM TOOTH EXTRACTION      OB History     Gravida  1   Para  1   Term  1   Preterm      AB      Living  1      SAB      IAB      Ectopic      Multiple      Live Births  1            Home Medications    Prior to Admission medications   Medication Sig Start Date End Date Taking? Authorizing Provider  etonogestrel (NEXPLANON) 68 MG IMPL implant 1 each by Subdermal route once.   Yes [provider]  ipratropium (ATROVENT) 0.03 % nasal spray Place 2 sprays into both nostrils 2 (two) times daily. 01/16/20  Yes Bing Neighbors, FNP  UNABLE TO FIND Med Name: reports using otc allergy eyedrops   Yes [provider]  brompheniramine-pseudoephedrine-DM 30-2-10 MG/5ML syrup Take 5 mLs by mouth 4 (four) times daily as needed. 01/16/20   Bing Neighbors, FNP  levocetirizine (XYZAL) 5 MG tablet Take 1 tablet (5 mg total) by mouth every evening. 01/16/20   Bing Neighbors, FNP  ondansetron (ZOFRAN) 8 MG tablet Take 1 tablet (8 mg total) by mouth every 8 (eight) hours as needed for nausea or vomiting. 06/20/19   Eustace Moore, MD  albuterol (VENTOLIN HFA) 108 (90 BASE) MCG/ACT inhaler Inhale 2 puffs into the lungs every 6 (six) hours as needed. Shortness of  breath 04/13/11 07/07/11  Antionette Char, MD  diphenhydrAMINE (BENADRYL) 25 MG tablet Take 1 tablet (25 mg total) by mouth every 8 (eight) hours as needed. 07/19/17 11/03/18  Roxy Horseman, PA-C  fluticasone (FLONASE) 50 MCG/ACT nasal spray Place 1 spray into both nostrils daily. 11/03/18 06/20/19  Hall-Potvin, [place], PA-C    Family History Family History  Problem Relation Age of Onset   Hypertension Maternal Grandmother    Hyperlipidemia Maternal Grandmother    Diabetes Maternal Grandmother    Breast cancer Maternal Grandmother    Hypertension Maternal Grandfather    Hyperlipidemia Maternal Grandfather    Diabetes Maternal Grandfather     Social History Social History   Tobacco Use   Smoking status: Former    Packs/day: 0.25    Pack years: 0.00    Types: Cigars, Cigarettes   Smokeless tobacco: Never   Tobacco comments:    Black & Milds occasionally  Vaping Use   Vaping Use: Every day  Substance Use Topics   Alcohol use: No   Drug use: No     Allergies  Banana   Review of Systems Review of Systems  Constitutional:  Negative for chills, fatigue and fever.  HENT:  Positive for rhinorrhea. Negative for congestion, ear pain, nosebleeds, postnasal drip, sinus pressure, sinus pain and sore throat.   Eyes:  Positive for photophobia, discharge, redness and visual disturbance. Negative for pain and itching.  Respiratory:  Negative for cough, shortness of breath and wheezing.   Gastrointestinal:  Negative for abdominal pain, diarrhea, nausea and vomiting.  Musculoskeletal:  Negative for arthralgias and myalgias.  Skin:  Negative for rash.  Neurological:  Negative for light-headedness and headaches.  Hematological:  Negative for adenopathy. Does not bruise/bleed easily.  Psychiatric/Behavioral:  Negative for confusion and sleep disturbance.     Physical Exam Triage Vital Signs ED Triage Vitals  Enc Vitals Group     BP 10/25/20 1355 117/67     Pulse Rate 10/25/20  1355 70     Resp 10/25/20 1355 18     Temp 10/25/20 1355 99.1 F (37.3 C)     Temp src --      SpO2 10/25/20 1355 100 %     Weight --      Height --      Head Circumference --      Peak Flow --      Pain Score 10/25/20 1353 0     Pain Loc --      Pain Edu? --      Excl. in GC? --    No data found.  Updated Vital Signs BP 117/67   Pulse 70   Temp 99.1 F (37.3 C)   Resp 18   LMP 10/20/2020   SpO2 100%   Visual Acuity Right Eye Distance: " blurry" Left Eye Distance: 20/40 Bilateral Distance: 20/30 (without presecription lenses)  Right Eye Near:   Left Eye Near:    Bilateral Near:     Physical Exam Vitals and nursing note reviewed.  Constitutional:      General: She is not in acute distress.    Appearance: Normal appearance. She is well-developed. She is not ill-appearing.  HENT:     Head: Normocephalic and atraumatic.     Right Ear: Tympanic membrane and ear canal normal.     Left Ear: Tympanic membrane and ear canal normal.     Nose: No congestion or rhinorrhea.     Mouth/Throat:     Pharynx: No oropharyngeal exudate or posterior oropharyngeal erythema.  Eyes:     General: No scleral icterus.       Right eye: Discharge present. No foreign body.     Extraocular Movements: Extraocular movements intact.     Right eye: Normal extraocular motion and no nystagmus.     Conjunctiva/sclera:     Right eye: Right conjunctiva is injected.     Pupils:     Right eye: No corneal abrasion or fluorescein uptake.     Funduscopic exam:    Right eye: No hemorrhage or papilledema.     Slit lamp exam:    Right eye: No corneal flare, corneal ulcer or photophobia.  Cardiovascular:     Rate and Rhythm: Normal rate and regular rhythm.     Heart sounds: No murmur heard. Pulmonary:     Effort: Pulmonary effort is normal. No respiratory distress.     Breath sounds: Normal breath sounds. No wheezing or rales.  Abdominal:     Palpations: Abdomen is soft.     Tenderness: There is  no abdominal tenderness.  Musculoskeletal:     Cervical back: Normal range of motion and neck supple. No rigidity.  Skin:    General: Skin is warm and dry.     Capillary Refill: Capillary refill takes less than 2 seconds.     Coloration: Skin is not jaundiced.     Findings: No rash.  Neurological:     General: No focal deficit present.     Mental Status: She is alert and oriented to person, place, and time.     Motor: No weakness.     Gait: Gait normal.  Psychiatric:        Mood and Affect: Mood normal.        Behavior: Behavior normal.     UC Treatments / Results  Labs (all labs ordered are listed, but only abnormal results are displayed) Labs Reviewed - No data to display  EKG   Radiology No results found.  Procedures Procedures (including critical care time)  Medications Ordered in UC Medications - No data to display  Initial Impression / Assessment and Plan / UC Course  I have reviewed the triage vital signs and the nursing notes.  Pertinent labs & imaging results that were available during my care of the patient were reviewed by me and considered in my medical decision making (see chart for details).     Follow up with ophthalmology, contact information provided for [facility], patient called them at discharge. Final Clinical Impressions(s) / UC Diagnoses   Final diagnoses:  Vision changes     Discharge Instructions      Follow up with eye specialist, today if possible.     ED Prescriptions   None    PDMP not reviewed this encounter.   Evern Core, PA-C 10/25/20 1501

## 2020-10-25 NOTE — Discharge Instructions (Addendum)
Follow up with eye specialist, today if possible.

## 2021-10-11 ENCOUNTER — Ambulatory Visit (INDEPENDENT_AMBULATORY_CARE_PROVIDER_SITE_OTHER): Payer: Medicaid Other | Admitting: Advanced Practice Midwife

## 2021-10-11 ENCOUNTER — Encounter: Payer: Self-pay | Admitting: Advanced Practice Midwife

## 2021-10-11 ENCOUNTER — Other Ambulatory Visit (HOSPITAL_COMMUNITY)
Admission: RE | Admit: 2021-10-11 | Discharge: 2021-10-11 | Disposition: A | Discharge status: Home / Self Care | Payer: Medicaid Other | Source: Ambulatory Visit | Attending: Advanced Practice Midwife | Admitting: Advanced Practice Midwife

## 2021-10-11 ENCOUNTER — Telehealth: Payer: Self-pay | Admitting: Family Medicine

## 2021-10-11 VITALS — BP 115/73 | HR 70 | Ht 63.0 in | Wt 174.6 lb

## 2021-10-11 DIAGNOSIS — Z01419 Encounter for gynecological examination (general) (routine) without abnormal findings: Secondary | ICD-10-CM

## 2021-10-11 DIAGNOSIS — B9689 Other specified bacterial agents as the cause of diseases classified elsewhere: Secondary | ICD-10-CM

## 2021-10-11 DIAGNOSIS — Z113 Encounter for screening for infections with a predominantly sexual mode of transmission: Secondary | ICD-10-CM

## 2021-10-11 DIAGNOSIS — Z30017 Encounter for initial prescription of implantable subdermal contraceptive: Secondary | ICD-10-CM | POA: Diagnosis not present

## 2021-10-11 DIAGNOSIS — Z3046 Encounter for surveillance of implantable subdermal contraceptive: Secondary | ICD-10-CM | POA: Diagnosis not present

## 2021-10-11 MED ORDER — ETONOGESTREL 68 MG ~~LOC~~ IMPL
68.0000 mg | DRUG_IMPLANT | Freq: Once | SUBCUTANEOUS | Status: AC
  Administered 2021-10-11: 68 mg via SUBCUTANEOUS

## 2021-10-11 NOTE — Telephone Encounter (Signed)
Called by after hours RN service about "needing orders clarified:". RN did not know the labs that needed   [facility] RN connected me to the lab to clarify.   They needed Cervicovaginal ancillary testing order  Placed this order for GC/CT, Trichomonas, BV, Yeast.   Federico Flake, MD, MPH, ABFM, [facility] Attending Physician [facility]

## 2021-10-11 NOTE — Progress Notes (Signed)
GYNECOLOGY ANNUAL PREVENTATIVE CARE ENCOUNTER NOTE  Subjective:   Diamond Rodriguez is a 31 y.o. G97P1001 female here for a routine annual gynecologic exam.  Current complaints: requesting Nexplanon removal and replacement. Had Nexplanon placed 08/2018, so it is due.   Denies abnormal vaginal bleeding, discharge, pelvic pain, problems with intercourse or other gynecologic concerns.   Happy with Nexplanon. Uses boric acid suppositories a few times after each cycle. This helps prevent BV/yeast.    Gynecologic History No LMP recorded. (Menstrual status: Irregular Periods). Contraception: Nexplanon Last Pap: Maybe 2021. Results were: normal per patient.  Last mammogram: NA, age . Results were: NA  Obstetric History OB History  Gravida Para Term Preterm AB Living  1 1 1     1   SAB IAB Ectopic Multiple Live Births          1    # Outcome Date GA Lbr Len/2nd Weight Sex Delivery Anes PTL Lv  1 Term 04/11/11 [redacted]w[redacted]d 29:42 / 00:29 6 lb 7.9 oz (2.945 kg) F Vag-Spont EPI  LIV    Past Medical History:  Diagnosis Date   Chlamydia    UTI (lower urinary tract infection)     Past Surgical History:  Procedure Laterality Date   WISDOM TOOTH EXTRACTION      Current Outpatient Medications on File Prior to Visit  Medication Sig Dispense Refill   etonogestrel (NEXPLANON) 68 MG IMPL implant 1 each by Subdermal route once.     brompheniramine-pseudoephedrine-DM 30-2-10 MG/5ML syrup Take 5 mLs by mouth 4 (four) times daily as needed. (Patient not taking: Reported on 10/11/2021) 140 mL 0   ipratropium (ATROVENT) 0.03 % nasal spray Place 2 sprays into both nostrils 2 (two) times daily. (Patient not taking: Reported on 10/11/2021) 30 mL 0   levocetirizine (XYZAL) 5 MG tablet Take 1 tablet (5 mg total) by mouth every evening. (Patient not taking: Reported on 10/11/2021) 30 tablet 0   ondansetron (ZOFRAN) 8 MG tablet Take 1 tablet (8 mg total) by mouth every 8 (eight) hours as needed for nausea or vomiting.  (Patient not taking: Reported on 10/11/2021) 20 tablet 0   UNABLE TO FIND Med Name: reports using otc allergy eyedrops (Patient not taking: Reported on 10/11/2021)     [DISCONTINUED] albuterol (VENTOLIN HFA) 108 (90 BASE) MCG/ACT inhaler Inhale 2 puffs into the lungs every 6 (six) hours as needed. Shortness of breath 1 Inhaler 3   [DISCONTINUED] diphenhydrAMINE (BENADRYL) 25 MG tablet Take 1 tablet (25 mg total) by mouth every 8 (eight) hours as needed. 20 tablet 0   [DISCONTINUED] fluticasone (FLONASE) 50 MCG/ACT nasal spray Place 1 spray into both nostrils daily. 16 g 0   No current facility-administered medications on file prior to visit.    Allergies  Allergen Reactions   Banana Anaphylaxis and Other (See Comments)    Throat closes up    Social History   Socioeconomic History   Marital status: Single    Spouse name: Not on file   Number of children: Not on file   Years of education: Not on file   Highest education level: Not on file  Occupational History   Not on file  Tobacco Use   Smoking status: Former    Packs/day: 0.25    Types: Cigars, Cigarettes   Smokeless tobacco: Never  Vaping Use   Vaping Use: Every day  Substance and Sexual Activity   Alcohol use: Yes    Comment: socially   Drug use: No   Sexual  activity: Yes    Birth control/protection: Implant  Other Topics Concern   Not on file  Social History Narrative   Not on file   Social Determinants of Health   Financial Resource Strain: Not on file  Food Insecurity: Not on file  Transportation Needs: Not on file  Physical Activity: Not on file  Stress: Not on file  Social Connections: Not on file  Intimate Partner Violence: Not on file    Family History  Problem Relation Age of Onset   Hypertension Maternal Grandmother    Hyperlipidemia Maternal Grandmother    Diabetes Maternal Grandmother    Breast cancer Maternal Grandmother    Hypertension Maternal Grandfather    Hyperlipidemia Maternal  Grandfather    Diabetes Maternal Grandfather     The following portions of the patient's history were reviewed and updated as appropriate: allergies, current medications, past family history, past medical history, past social history, past surgical history and problem list.  Review of Systems Pertinent items noted in HPI and remainder of comprehensive ROS otherwise negative.   Objective:  BP 115/73   Pulse 70   Ht 5\' 3"  (1.6 m)   Wt 174 lb 9.6 oz (79.2 kg)   BMI 30.93 kg/m  CONSTITUTIONAL: Well-developed, well-nourished female in no acute distress.  HENT:  Normocephalic, atraumatic, External right and left ear normal. Oropharynx is clear and moist EYES: Conjunctivae and EOM are normal. Pupils are equal, round, and reactive to light. No scleral icterus.  NECK: Normal range of motion, supple, no masses.  Normal thyroid.  SKIN: Skin is warm and dry. No rash noted. Not diaphoretic. No erythema. No pallor. NEUROLOGIC: Alert and oriented to person, place, and time. Normal reflexes, muscle tone coordination. No cranial nerve deficit noted. PSYCHIATRIC: Normal mood and affect. Normal behavior. Normal judgment and thought content. CARDIOVASCULAR: Normal heart rate noted, regular rhythm RESPIRATORY: Clear to auscultation bilaterally. Effort and breath sounds normal, no problems with respiration noted. BREASTS: Symmetric in size. No masses, skin changes, nipple drainage, or lymphadenopathy. ABDOMEN: Soft, normal bowel sounds, no distention noted.  No tenderness, rebound or guarding.  PELVIC: Normal appearing external genitalia; normal appearing vaginal mucosa and cervix.  No abnormal discharge noted.  Pap smear obtained.  Normal uterine size, no other palpable masses, no uterine or adnexal tenderness. MUSCULOSKELETAL: Normal range of motion. No tenderness.  No cyanosis, clubbing, or edema.  2+ distal pulses.   GYNECOLOGY OFFICE PROCEDURE NOTE  Nexplanon Removal and Insertion  Patient  identified, informed consent performed, consent signed.   Patient does understand that irregular bleeding is a very common side effect of this medication. She was advised to have backup contraception for one week after replacement of the implant. Pregnancy test in clinic today was negative.  Appropriate time out taken. Nexplanon site identified. Area prepped in usual sterile fashon. One ml of 1% lidocaine was used to anesthetize the area at the distal end of the implant. A small stab incision was made right beside the implant on the distal portion. The Nexplanon rod was grasped using hemostats and removed without difficulty. There was minimal blood loss. There were no complications. Prior Nexplanon was at the old insertion site, so new insertion site was then injected with 3 ml of 1 % lidocaine. She was re-prepped with betadine, Nexplanon removed from packaging, Device confirmed in needle, then inserted full length of needle and withdrawn per handbook instructions. Nexplanon was able to palpated in the patient's arm; patient palpated the insert herself.  There was  minimal blood loss. Patient insertion site covered with guaze and a pressure bandage to reduce any bruising. The patient tolerated the procedure well and was given post procedure instructions.  She was advised to have backup contraception for one week.        Assessment and Plan:  1. Encounter for annual routine gynecological examination  - Cytology - PAP( [place]) - POCT urine pregnancy - HIV, RPR, Hep C, GC/CT   2. Nexplanon insertion   3. Nexplanon removal   Will follow up results of pap smear and manage accordingly. Routine preventative health maintenance measures emphasized. Please refer to After Visit Summary for other counseling recommendations.    Thressa Sheller DNP, CNM  10/11/21  8:29 AM

## 2021-10-11 NOTE — Progress Notes (Signed)
NGYN pt presents to establish care. Pt due for PAP and is requesting STI testing.  Neplanon placed 08-2018. Pt requesting Nexplanon Removal and reinsertion.

## 2021-10-12 LAB — RPR: RPR Ser Ql: NONREACTIVE

## 2021-10-12 LAB — HIV ANTIBODY (ROUTINE TESTING W REFLEX): HIV Screen 4th Generation wRfx: NONREACTIVE

## 2021-10-12 LAB — HEPATITIS C ANTIBODY: Hep C Virus Ab: NONREACTIVE

## 2021-10-13 LAB — CERVICOVAGINAL ANCILLARY ONLY
Bacterial Vaginitis (gardnerella): POSITIVE — AB
Candida Glabrata: NEGATIVE
Candida Vaginitis: NEGATIVE
Chlamydia: NEGATIVE
Comment: NEGATIVE
Comment: NEGATIVE
Comment: NEGATIVE
Comment: NEGATIVE
Comment: NEGATIVE
Comment: NORMAL
Neisseria Gonorrhea: NEGATIVE
Trichomonas: NEGATIVE

## 2021-10-14 LAB — CYTOLOGY - PAP: Diagnosis: NEGATIVE

## 2021-10-14 MED ORDER — METRONIDAZOLE 500 MG PO TABS: 500.0000 mg | ORAL_TABLET | Freq: Two times a day (BID) | ORAL | 0 refills | Status: DC

## 2021-10-14 NOTE — Addendum Note (Signed)
Addended by: Geanie Berlin on: 10/14/2021 05:26 PM   Modules accepted: Orders

## 2021-10-15 ENCOUNTER — Telehealth: Payer: Self-pay | Admitting: General Practice

## 2021-10-15 ENCOUNTER — Encounter: Payer: Self-pay | Admitting: General Practice

## 2021-10-15 ENCOUNTER — Ambulatory Visit: Payer: Medicaid Other | Admitting: Family Medicine

## 2021-10-15 NOTE — Telephone Encounter (Signed)
-----   Message from Federico Flake, MD sent at 10/14/2021  5:26 PM EDT ----- BV in swab, I sent in metronidazole

## 2021-10-15 NOTE — Telephone Encounter (Signed)
Called patient regarding results, no answer- left message asking she check her mychart account for additional information. Stated she may call us back if she has questions.

## 2021-10-15 NOTE — Telephone Encounter (Signed)
Called patient and apologized for mix up in scheduling at the wrong office. Recommended she reach out to [place] office and provided their contact info. Discussed with patient since Nexplanon was just placed 4 days ago some tenderness at the site is not uncommon. Recommended trialing ibuprofen for relief. Patient verbalized understanding.

## 2021-10-15 NOTE — Telephone Encounter (Signed)
Patient called and left message on nurse voicemail line stating she is returning our phone call. Patient states she isn't able to get into mychart so she would like to call her back.   Called patient and informed her of results as well as medication instructions. Assisted patient with mychart access and sent mychart message with BV information per patient request. Patient states since she had the nexplanon inserted a month ago in the new location she has had a lot of pain around that area of her arm even with the lightest touch. Discussed with Dr Crissie Reese who recommended follow up appt. Offered appt today at 3:55 to patient. Patient verbalized understanding.

## 2022-11-15 ENCOUNTER — Ambulatory Visit (HOSPITAL_COMMUNITY)
Admission: EM | Admit: 2022-11-15 | Discharge: 2022-11-15 | Disposition: A | Discharge status: Home / Self Care | Payer: Medicaid Other | Attending: Internal Medicine | Admitting: Internal Medicine

## 2022-11-15 ENCOUNTER — Encounter (HOSPITAL_COMMUNITY): Payer: Self-pay

## 2022-11-15 DIAGNOSIS — S91332A Puncture wound without foreign body, left foot, initial encounter: Secondary | ICD-10-CM | POA: Diagnosis not present

## 2022-11-15 DIAGNOSIS — Z23 Encounter for immunization: Secondary | ICD-10-CM

## 2022-11-15 MED ORDER — TETANUS-DIPHTH-ACELL PERTUSSIS 5-2.5-18.5 LF-MCG/0.5 IM SUSY
0.5000 mL | PREFILLED_SYRINGE | Freq: Once | INTRAMUSCULAR | Status: AC
  Administered 2022-11-15: 0.5 mL via INTRAMUSCULAR

## 2022-11-15 MED ORDER — TETANUS-DIPHTH-ACELL PERTUSSIS 5-2.5-18.5 LF-MCG/0.5 IM SUSY
PREFILLED_SYRINGE | INTRAMUSCULAR | Status: AC
  Filled 2022-11-15: qty 0.5

## 2022-11-15 NOTE — Discharge Instructions (Signed)
We updated your tetanus shot today in the clinic. Apply cool/warm compresses as needed to the bottom of the foot to provide soothing and pain relief. You may purchase over-the-counter Dr. Margart Sickles foot pads to provide comfort while standing.  Schedule an appointment with [facility] as needed.  If you develop any new or worsening symptoms or if your symptoms do not start to improve, pleases return here or follow-up with your primary care provider. If your symptoms are severe, please go to the emergency room.

## 2022-11-15 NOTE — ED Provider Notes (Signed)
[facility]    CSN: [id number] Arrival date & time: 11/15/22  1239      History   Chief Complaint Chief Complaint  Patient presents with   Puncture Wound    Left foot    HPI [name] is a 32 y.o. female.   Patient presents to urgent care for evaluation of puncture wound to the sole of the left foot that happened while she was at work yesterday.  Patient works at a [employer] and was walking with shoes on at work when one of the security sensor needles punctured the bottom of her left foot through her shoe.  Unsure of last tetanus injection but believes it was greater than 10 years ago.  Experiencing pain to the puncture site.  She was able to pull the needle out of the foot and ambulate without difficulty after injury.  She is not diabetic and denies history of immunosuppression.  No numbness or tingling distally to injury.  No bleeding from site.     Past Medical History:  Diagnosis Date   Chlamydia    UTI (lower urinary tract infection)     Patient Active Problem List   Diagnosis Date Noted   Normal delivery 04/12/2011    Past Surgical History:  Procedure Laterality Date   WISDOM TOOTH EXTRACTION      OB History     Gravida  1   Para  1   Term  1   Preterm      AB      Living  1      SAB      IAB      Ectopic      Multiple      Live Births  1            Home Medications    Prior to Admission medications   Medication Sig Start Date End Date Taking? Authorizing Provider  brompheniramine-pseudoephedrine-DM 30-2-10 MG/5ML syrup Take 5 mLs by mouth 4 (four) times daily as needed. Patient not taking: Reported on 10/11/2021 01/16/20   Bing Neighbors, NP  etonogestrel (NEXPLANON) 68 MG IMPL implant 1 each by Subdermal route once.    [provider]  ipratropium (ATROVENT) 0.03 % nasal spray Place 2 sprays into both nostrils 2 (two) times daily. Patient not taking: Reported on 10/11/2021 01/16/20   Bing Neighbors, NP  levocetirizine (XYZAL) 5 MG tablet Take 1 tablet (5 mg total) by mouth every evening. Patient not taking: Reported on 10/11/2021 01/16/20   Bing Neighbors, NP  metroNIDAZOLE (FLAGYL) 500 MG tablet Take 1 tablet (500 mg total) by mouth 2 (two) times daily. 10/14/21   Federico Flake, MD  ondansetron (ZOFRAN) 8 MG tablet Take 1 tablet (8 mg total) by mouth every 8 (eight) hours as needed for nausea or vomiting. Patient not taking: Reported on 10/11/2021 06/20/19   Eustace Moore, MD  UNABLE TO FIND Med Name: reports using otc allergy eyedrops Patient not taking: Reported on 10/11/2021    [provider]  albuterol (VENTOLIN HFA) 108 (90 BASE) MCG/ACT inhaler Inhale 2 puffs into the lungs every 6 (six) hours as needed. Shortness of breath 04/13/11 07/07/11  Antionette Char, MD  diphenhydrAMINE (BENADRYL) 25 MG tablet Take 1 tablet (25 mg total) by mouth every 8 (eight) hours as needed. 07/19/17 11/03/18  Roxy Horseman, PA-C  fluticasone (FLONASE) 50 MCG/ACT nasal spray Place 1 spray into both nostrils daily. 11/03/18 06/20/19  Hall-Potvin,  [place], PA-C    Family History Family History  Problem Relation Age of Onset   Hypertension Maternal Grandmother    Hyperlipidemia Maternal Grandmother    Diabetes Maternal Grandmother    Breast cancer Maternal Grandmother    Hypertension Maternal Grandfather    Hyperlipidemia Maternal Grandfather    Diabetes Maternal Grandfather     Social History Social History   Tobacco Use   Smoking status: Former    Current packs/day: 0.25    Types: Cigars, Cigarettes   Smokeless tobacco: Never  Vaping Use   Vaping status: Every Day  Substance Use Topics   Alcohol use: Yes    Comment: socially   Drug use: No     Allergies   Banana   Review of Systems Review of Systems Per HPI  Physical Exam Triage Vital Signs ED Triage Vitals  Encounter Vitals Group     BP 11/15/22 1422 108/71     Systolic BP Percentile --       Diastolic BP Percentile --      Pulse Rate 11/15/22 1422 65     Resp 11/15/22 1422 16     Temp 11/15/22 1422 98.6 F (37 C)     Temp Source 11/15/22 1422 Oral     SpO2 11/15/22 1422 99 %     Weight 11/15/22 1422 180 lb (81.6 kg)     Height 11/15/22 1422 5\' 3"  (1.6 m)     Head Circumference --      Peak Flow --      Pain Score 11/15/22 1421 5     Pain Loc --      Pain Education --      Exclude from Growth Chart --    No data found.  Updated Vital Signs BP 108/71 (BP Location: Right Arm)   Pulse 65   Temp 98.6 F (37 C) (Oral)   Resp 16   Ht 5\' 3"  (1.6 m)   Wt 180 lb (81.6 kg)   LMP 11/08/2022 (Approximate)   SpO2 99%   BMI 31.89 kg/m   Visual Acuity Right Eye Distance:   Left Eye Distance:   Bilateral Distance:    Right Eye Near:   Left Eye Near:    Bilateral Near:     Physical Exam Vitals and nursing note reviewed.  Constitutional:      Appearance: She is not ill-appearing or toxic-appearing.  HENT:     Head: Normocephalic and atraumatic.     Right Ear: Hearing and external ear normal.     Left Ear: Hearing and external ear normal.     Nose: Nose normal.     Mouth/Throat:     Lips: Pink.  Eyes:     General: Lids are normal. Vision grossly intact. Gaze aligned appropriately.     Extraocular Movements: Extraocular movements intact.     Conjunctiva/sclera: Conjunctivae normal.  Pulmonary:     Effort: Pulmonary effort is normal.  Musculoskeletal:     Cervical back: Neck supple.  Skin:    General: Skin is warm and dry.     Capillary Refill: Capillary refill takes less than 2 seconds.     Findings: Wound (Barely visible puncture pinpoint wound to the ball of the left foot) present. No rash.     Comments: No drainage or surrounding area of erythema/induration to the wound.  Neurological:     General: No focal deficit present.     Mental Status: She is alert and oriented to person, place,  and time. Mental status is at baseline.     Cranial Nerves: No  dysarthria or facial asymmetry.  Psychiatric:        Mood and Affect: Mood normal.        Speech: Speech normal.        Behavior: Behavior normal.        Thought Content: Thought content normal.        Judgment: Judgment normal.      UC Treatments / Results  Labs (all labs ordered are listed, but only abnormal results are displayed) Labs Reviewed - No data to display  EKG   Radiology No results found.  Procedures Procedures (including critical care time)  Medications Ordered in UC Medications  Tdap (BOOSTRIX) injection 0.5 mL (0.5 mLs Intramuscular Given 11/15/22 1440)    Initial Impression / Assessment and Plan / UC Course  I have reviewed the triage vital signs and the nursing notes.  Pertinent labs & imaging results that were available during my care of the patient were reviewed by me and considered in my medical decision making (see chart for details).   1.  Puncture wound of left foot, need for tetanus booster No indication for imaging due to low suspicion for foreign body to the soft tissue of the left foot or bony abnormality.  Patient ambulatory and neurovascularly intact distally to injury.  May use Tylenol or ibuprofen as needed for foot pain.  Tetanus injection updated today (last Tdap was in 2013 per chart review).  May follow-up with [facility] as needed.  Counseled patient on potential for adverse effects with medications prescribed/recommended today, strict ER and return-to-clinic precautions discussed, patient verbalized understanding.    Final Clinical Impressions(s) / UC Diagnoses   Final diagnoses:  Puncture wound of left foot, initial encounter  Need for tetanus booster     Discharge Instructions      We updated your tetanus shot today in the clinic. Apply cool/warm compresses as needed to the bottom of the foot to provide soothing and pain relief. You may purchase over-the-counter Dr. Margart Sickles foot pads to provide comfort while  standing.  Schedule an appointment with [facility] as needed.  If you develop any new or worsening symptoms or if your symptoms do not start to improve, pleases return here or follow-up with your primary care provider. If your symptoms are severe, please go to the emergency room.    ED Prescriptions   None    PDMP not reviewed this encounter.   Carlisle Beers, [place] 11/15/22 1443

## 2022-11-15 NOTE — ED Triage Notes (Signed)
Patient here today with c/o left foot pain after a sensor needle for clothing poked her in the bottom of her left foot yesterday. Patient is unsuire if up to date on her Tetanus.

## 2023-02-19 ENCOUNTER — Encounter (HOSPITAL_COMMUNITY): Payer: Self-pay

## 2023-02-19 ENCOUNTER — Ambulatory Visit (HOSPITAL_COMMUNITY)
Admission: EM | Admit: 2023-02-19 | Discharge: 2023-02-19 | Disposition: A | Discharge status: Home / Self Care | Payer: Medicaid Other | Attending: Physician Assistant | Admitting: Physician Assistant

## 2023-02-19 DIAGNOSIS — N644 Mastodynia: Secondary | ICD-10-CM

## 2023-02-19 DIAGNOSIS — Z803 Family history of malignant neoplasm of breast: Secondary | ICD-10-CM | POA: Diagnosis not present

## 2023-02-19 LAB — POCT URINE PREGNANCY: Preg Test, Ur: NEGATIVE

## 2023-02-19 MED ORDER — IBUPROFEN 800 MG PO TABS: 800.0000 mg | ORAL_TABLET | Freq: Three times a day (TID) | ORAL | 0 refills | Status: DC

## 2023-02-19 NOTE — ED Provider Notes (Signed)
[facility]    CSN: [id number] Arrival date & time: 02/19/23  1451      History   Chief Complaint Chief Complaint  Patient presents with   Breast Pain    HPI Diamond Rodriguez is a 32 y.o. female.   Patient presents today with a 37-month history of bilateral breast pain that is worse on the left.  She reports pain is worse with palpation or if she is leaning forward, rated 8 on a 0-10 pain scale, described as soreness, no alleviating factors identified.  She denies any nipple discharge or skin changes.  Denies any fever, nausea, vomiting.  She is not breast-feeding and denies any recent nipple piercing or manipulation of the breast.  She does have a strong family history of breast cancer in her maternal grandmother, maternal cousin, paternal grandmother.  She is on Nexplanon and has had this placed June 2023.  She does have irregular menstrual cycles as a result but is no specific concern for pregnancy.  She has not noticed any association with menstrual bleeding and reports the pain is constant.  She has not tried any over-the-counter medication for symptom management.    Past Medical History:  Diagnosis Date   Chlamydia    UTI (lower urinary tract infection)     Patient Active Problem List   Diagnosis Date Noted   Normal delivery 04/12/2011    Past Surgical History:  Procedure Laterality Date   WISDOM TOOTH EXTRACTION      OB History     Gravida  1   Para  1   Term  1   Preterm      AB      Living  1      SAB      IAB      Ectopic      Multiple      Live Births  1            Home Medications    Prior to Admission medications   Medication Sig Start Date End Date Taking? Authorizing Provider  ibuprofen (ADVIL) 800 MG tablet Take 1 tablet (800 mg total) by mouth 3 (three) times daily. 02/19/23  Yes Josalynn Johndrow, Noberto Retort, PA-C  etonogestrel (NEXPLANON) 68 MG IMPL implant 1 each by Subdermal route once.    [provider]   albuterol (VENTOLIN HFA) 108 (90 BASE) MCG/ACT inhaler Inhale 2 puffs into the lungs every 6 (six) hours as needed. Shortness of breath 04/13/11 07/07/11  Antionette Char, MD  diphenhydrAMINE (BENADRYL) 25 MG tablet Take 1 tablet (25 mg total) by mouth every 8 (eight) hours as needed. 07/19/17 11/03/18  Roxy Horseman, PA-C  fluticasone (FLONASE) 50 MCG/ACT nasal spray Place 1 spray into both nostrils daily. 11/03/18 06/20/19  Hall-Potvin, [place], PA-C    Family History Family History  Problem Relation Age of Onset   Hypertension Maternal Grandmother    Hyperlipidemia Maternal Grandmother    Diabetes Maternal Grandmother    Breast cancer Maternal Grandmother    Hypertension Maternal Grandfather    Hyperlipidemia Maternal Grandfather    Diabetes Maternal Grandfather     Social History Social History   Tobacco Use   Smoking status: Former    Current packs/day: 0.25    Types: Cigars, Cigarettes   Smokeless tobacco: Never  Vaping Use   Vaping status: Every Day   Substances: Nicotine, Flavoring  Substance Use Topics   Alcohol use: Yes    Comment: socially   Drug use:  No     Allergies   Banana   Review of Systems Review of Systems  Constitutional:  Positive for activity change. Negative for appetite change, fatigue, fever and unexpected weight change.  Respiratory:  Negative for cough and shortness of breath.   Cardiovascular:  Negative for chest pain and palpitations.  Gastrointestinal:  Negative for abdominal pain, diarrhea, nausea and vomiting.     Physical Exam Triage Vital Signs ED Triage Vitals  Encounter Vitals Group     BP 02/19/23 1537 118/79     Systolic BP Percentile --      Diastolic BP Percentile --      Pulse Rate 02/19/23 1537 68     Resp 02/19/23 1537 18     Temp 02/19/23 1537 99 F (37.2 C)     Temp Source 02/19/23 1537 Oral     SpO2 02/19/23 1537 98 %     Weight 02/19/23 1536 186 lb (84.4 kg)     Height 02/19/23 1536 5\' 3"  (1.6 m)     Head  Circumference --      Peak Flow --      Pain Score 02/19/23 1535 8     Pain Loc --      Pain Education --      Exclude from Growth Chart --    No data found.  Updated Vital Signs BP 118/79 (BP Location: Right Arm)   Pulse 68   Temp 99 F (37.2 C) (Oral)   Resp 18   Ht 5\' 3"  (1.6 m)   Wt 186 lb (84.4 kg)   LMP 01/11/2023 (Approximate)   SpO2 98%   BMI 32.95 kg/m   Visual Acuity Right Eye Distance:   Left Eye Distance:   Bilateral Distance:    Right Eye Near:   Left Eye Near:    Bilateral Near:     Physical Exam Vitals reviewed.  Constitutional:      General: She is awake. She is not in acute distress.    Appearance: Normal appearance. She is well-developed. She is not ill-appearing.     Comments: Very pleasant female appears stated age in no acute distress laying back on exam room table holding her breast.  HENT:     Head: Normocephalic and atraumatic.  Cardiovascular:     Rate and Rhythm: Normal rate and regular rhythm.     Heart sounds: Normal heart sounds, S1 normal and S2 normal. No murmur heard. Pulmonary:     Effort: Pulmonary effort is normal.     Breath sounds: Normal breath sounds. No wheezing, rhonchi or rales.     Comments: Clear to auscultation bilaterally Chest:  Breasts:    Right: Tenderness present. No swelling, bleeding, inverted nipple, mass or skin change.     Left: Tenderness present. No swelling, bleeding, inverted nipple, mass or skin change.  Abdominal:     General: Bowel sounds are normal.     Palpations: Abdomen is soft.     Tenderness: There is no abdominal tenderness. There is no right CVA tenderness, left CVA tenderness, guarding or rebound.  Psychiatric:        Behavior: Behavior is cooperative.      UC Treatments / Results  Labs (all labs ordered are listed, but only abnormal results are displayed) Labs Reviewed  POCT URINE PREGNANCY    EKG   Radiology No results found.  Procedures Procedures (including critical  care time)  Medications Ordered in UC Medications - No data to display  Initial Impression / Assessment and Plan / UC Course  I have reviewed the triage vital signs and the nursing notes.  Pertinent labs & imaging results that were available during my care of the patient were reviewed by me and considered in my medical decision making (see chart for details).     Patient is well-appearing, afebrile, nontoxic, nontachycardic.  Urine pregnancy was negative.  She did have significant tenderness during breast exam.  She was started on ibuprofen 800 mg to help with pain.  We discussed that she should not take NSAIDs with this medication due to risk of GI bleeding.  Encouraged her to use supportive undergarments for additional symptom relief.  Given her worsening pain and history of breast cancer diagnostic mammogram and ultrasounds were ordered.  Discussed that if she does not hear from the breast center within a few days she should return here or contact us so we can check on the status of her referral.  If anything worsens and she has pain, mass, fever, nausea, vomiting she needs to be seen immediately.  Strict return precautions given.  Final Clinical Impressions(s) / UC Diagnoses   Final diagnoses:  Mastalgia  Family history of breast cancer     Discharge Instructions      Take ibuprofen 800 mg to help with the pain.  Wear supportive bras and undergarments.  Do not take NSAIDs with ibuprofen including aspirin, ibuprofen/Advil, naproxen/Aleve.  You can use Tylenol/acetaminophen as needed for additional pain relief.  We have ordered a mammogram and ultrasound.  Someone should contact you within the next few days to schedule this.  If you do not hear from them please contact us.  If anything changes and you have increasing pain, redness of the breast, change in your skin, fever, nausea, vomiting you need to be seen immediately.     ED Prescriptions     Medication Sig Dispense Auth.  Provider   ibuprofen (ADVIL) 800 MG tablet Take 1 tablet (800 mg total) by mouth 3 (three) times daily. 21 tablet Saraya Tirey, Noberto Retort, PA-C      PDMP not reviewed this encounter.   Jeani Hawking, PA-C 02/19/23 1637

## 2023-02-19 NOTE — ED Triage Notes (Signed)
Left Breast Pain mainly but pain in both. Waking up with numbness in the finger tips as well. Onset of symptoms in the fingers a month ago and the breast for weeks (2 months).   Breast cancer does run in her family.

## 2023-02-19 NOTE — Discharge Instructions (Signed)
Take ibuprofen 800 mg to help with the pain.  Wear supportive bras and undergarments.  Do not take NSAIDs with ibuprofen including aspirin, ibuprofen/Advil, naproxen/Aleve.  You can use Tylenol/acetaminophen as needed for additional pain relief.  We have ordered a mammogram and ultrasound.  Someone should contact you within the next few days to schedule this.  If you do not hear from them please contact us.  If anything changes and you have increasing pain, redness of the breast, change in your skin, fever, nausea, vomiting you need to be seen immediately.

## 2023-02-20 ENCOUNTER — Telehealth (HOSPITAL_COMMUNITY): Payer: Self-pay

## 2023-02-20 NOTE — Telephone Encounter (Signed)
Pt wants her ultrasound reordered to [facility]. They have earlier appts.

## 2023-02-20 NOTE — Telephone Encounter (Signed)
I have printed the mmg/US orders to fax to this other facility. If they won't take our orders (since I doubt we are credentialed with them), she should call her OBGYN about this problem.

## 2023-02-23 ENCOUNTER — Ambulatory Visit: Payer: Medicaid Other | Admitting: Obstetrics and Gynecology

## 2023-02-23 ENCOUNTER — Encounter: Payer: Self-pay | Admitting: Obstetrics and Gynecology

## 2023-02-23 VITALS — BP 117/72 | HR 71 | Wt 164.0 lb

## 2023-02-23 DIAGNOSIS — Z758 Other problems related to medical facilities and other health care: Secondary | ICD-10-CM

## 2023-02-23 DIAGNOSIS — N644 Mastodynia: Secondary | ICD-10-CM

## 2023-02-23 NOTE — Progress Notes (Signed)
32 yo P1 presenting today for problem visit regarding a 64-month history of breast pain. Patient reports pain was present in 2012 following birth of her child. Patient persisted while on depo-provera. Pain improved for 2 years while off depo-provera and was only present during her cycle. She has been using Nexplanon for the past 4 years and reports Loyed Wilmes pain. She describes the pain as bilateral and located on the outer quadrants of both breast. She is without any other complaints. Patient is not sexually active  Past Medical History:  Diagnosis Date   Chlamydia    UTI (lower urinary tract infection)    Past Surgical History:  Procedure Laterality Date   WISDOM TOOTH EXTRACTION     Family History  Problem Relation Age of Onset   Hypertension Maternal Grandmother    Hyperlipidemia Maternal Grandmother    Diabetes Maternal Grandmother    Breast cancer Maternal Grandmother    Hypertension Maternal Grandfather    Hyperlipidemia Maternal Grandfather    Diabetes Maternal Grandfather    Social History   Tobacco Use   Smoking status: Former    Current packs/day: 0.25    Types: Cigars, Cigarettes   Smokeless tobacco: Never  Vaping Use   Vaping status: Every Day   Substances: Nicotine, Flavoring  Substance Use Topics   Alcohol use: Yes    Comment: socially   Drug use: No   ROS See pertinent in HPI. All other systems reviewed and non contributory Blood pressure 117/72, pulse 71, weight 164 lb (74.4 kg), last menstrual period 01/11/2023. GENERAL: Well-developed, well-nourished female in no acute distress.  BREASTS: Symmetric in size. No palpable masses or lymphadenopathy, skin changes, or nipple drainage. Tenderness to touch on lateral aspect of both breast EXTREMITIES: No cyanosis, clubbing, or edema, 2+ distal pulses.  A/P 32 yo with bilateral breast pain - Patient scheduled for bilateral breast ultrasounds on 11/5 - Advised the use of NSAID and supportive bras such as sports  bras - Suspect progesterone effect of contraception.  - RTC prn contraception counseling - Patient is current on pap smear

## 2023-02-23 NOTE — Progress Notes (Signed)
Pt states she is here today for breast exam.  Pt was recently seen at Urgent care and would like another opininon. Pt states she has been having breast pain x 1-2 months that has has worsened.  Pt has some tenderness with touch.   Pt has Nexplanon for [facility] - pt has irreg cycles.

## 2023-03-03 ENCOUNTER — Telehealth (HOSPITAL_COMMUNITY): Payer: Self-pay

## 2023-03-03 NOTE — Telephone Encounter (Signed)
Patient came into the office this morning requesting a print out of the orders so she can take them to the imaging facility. Patient states that she had an appointment today with [facility] but they canceled the appointment due to not having an order. I printed the orders, Lurena Joiner Rising signed the orders and I handed them to the patient. Patient will take the orders to the imaging facility and call us if this does not work.

## 2023-03-11 ENCOUNTER — Ambulatory Visit: Payer: Medicaid Other

## 2023-03-11 ENCOUNTER — Ambulatory Visit
Admission: RE | Admit: 2023-03-11 | Discharge: 2023-03-11 | Disposition: A | Discharge status: Home / Self Care | Payer: Medicaid Other | Source: Ambulatory Visit | Attending: Physician Assistant | Admitting: Physician Assistant

## 2023-03-11 DIAGNOSIS — N644 Mastodynia: Secondary | ICD-10-CM

## 2023-04-04 ENCOUNTER — Ambulatory Visit (HOSPITAL_COMMUNITY)
Admission: EM | Admit: 2023-04-04 | Discharge: 2023-04-04 | Disposition: A | Discharge status: Home / Self Care | Payer: Medicaid Other | Attending: Emergency Medicine | Admitting: Emergency Medicine

## 2023-04-04 ENCOUNTER — Encounter (HOSPITAL_COMMUNITY): Payer: Self-pay | Admitting: Emergency Medicine

## 2023-04-04 DIAGNOSIS — K0889 Other specified disorders of teeth and supporting structures: Secondary | ICD-10-CM

## 2023-04-04 MED ORDER — AMOXICILLIN-POT CLAVULANATE 875-125 MG PO TABS: 1.0000 | ORAL_TABLET | Freq: Two times a day (BID) | ORAL | 0 refills | Status: DC

## 2023-04-04 MED ORDER — KETOROLAC TROMETHAMINE 30 MG/ML IJ SOLN
INTRAMUSCULAR | Status: AC
  Filled 2023-04-04: qty 1

## 2023-04-04 MED ORDER — ACETAMINOPHEN 500 MG PO TABS: 500.0000 mg | ORAL_TABLET | Freq: Four times a day (QID) | ORAL | 0 refills | Status: DC | PRN

## 2023-04-04 MED ORDER — KETOROLAC TROMETHAMINE 60 MG/2ML IM SOLN
30.0000 mg | Freq: Once | INTRAMUSCULAR | Status: AC
  Administered 2023-04-04: 30 mg via INTRAMUSCULAR

## 2023-04-04 MED ORDER — IBUPROFEN 800 MG PO TABS: 800.0000 mg | ORAL_TABLET | Freq: Three times a day (TID) | ORAL | 0 refills | Status: AC

## 2023-04-04 NOTE — Discharge Instructions (Addendum)
We have given you Toradol in clinic to help with pain. For any breakthrough pain you can take 500 mg of Tylenol every 8 hours. Starting tomorrow you can alternate the ibuprofen and tylenol every 4-6 hours.  Start the antibiotics today and take them twice daily until finished, you can take them with food to prevent gastrointestinal upset.  It is important that you follow-up with a dentist, as without treatment of the root of the issue this will likely keep reoccurring.  Return to clinic for any new or urgent symptoms.  [facility] [place] Address: [address] [place] [address], [place], [place] [address] Phone: [phone]  [facility] [phone] extension [phone] [address]  Dr. [place] Marseilles [phone] [address]  [place] [phone] [address] [facility] [place].  [facility] [phone] extension [phone] [place], [place], [address] First come first serve for the first 10 clients.  May do simple extractions only, no wisdom teeth or surgery.  You may try the second for Thursday of the month starting at 6:30 AM.  [facility] You may call the school to see if they are still helping to provide dental care for emergent cases.

## 2023-04-04 NOTE — ED Provider Notes (Signed)
[facility]    CSN: [id number] Arrival date & time: 04/04/23  1524      History   Chief Complaint No chief complaint on file.   HPI Diamond Rodriguez is a 32 y.o. female.   Patient presents to clinic for left lower dental pain that started a few days ago.  She thinks the area might be swollen and noticed that one of her teeth is tender to touch.  She knows she has some cavities, but has been unable to find anyone who accepts her Medicaid.  She did take ibuprofen earlier today.  Reports the pain is so severe it is interfering with her work.  She has not had a fever.  She has been able to eat and drink.  The history is provided by the patient and medical records.    Past Medical History:  Diagnosis Date   Chlamydia    UTI (lower urinary tract infection)     Patient Active Problem List   Diagnosis Date Noted   Normal delivery 04/12/2011    Past Surgical History:  Procedure Laterality Date   WISDOM TOOTH EXTRACTION      OB History     Gravida  1   Para  1   Term  1   Preterm      AB      Living  1      SAB      IAB      Ectopic      Multiple      Live Births  1            Home Medications    Prior to Admission medications   Medication Sig Start Date End Date Taking? Authorizing Provider  acetaminophen (TYLENOL) 500 MG tablet Take 1 tablet (500 mg total) by mouth every 6 (six) hours as needed. 04/04/23  Yes Rinaldo Ratel, [place] N, FNP  amoxicillin-clavulanate (AUGMENTIN) 875-125 MG tablet Take 1 tablet by mouth every 12 (twelve) hours. 04/04/23  Yes Rinaldo Ratel, [place] N, FNP  ibuprofen (ADVIL) 800 MG tablet Take 1 tablet (800 mg total) by mouth 3 (three) times daily. 04/04/23  Yes Rinaldo Ratel, [place] N, FNP  etonogestrel (NEXPLANON) 68 MG IMPL implant 1 each by Subdermal route once.    [provider]  albuterol (VENTOLIN HFA) 108 (90 BASE) MCG/ACT inhaler Inhale 2 puffs into the lungs every 6 (six) hours as needed. Shortness of  breath 04/13/11 07/07/11  Antionette Char, MD  diphenhydrAMINE (BENADRYL) 25 MG tablet Take 1 tablet (25 mg total) by mouth every 8 (eight) hours as needed. 07/19/17 11/03/18  Roxy Horseman, PA-C  fluticasone (FLONASE) 50 MCG/ACT nasal spray Place 1 spray into both nostrils daily. 11/03/18 06/20/19  Hall-Potvin, [place], PA-C    Family History Family History  Problem Relation Age of Onset   Hypertension Maternal Grandmother    Hyperlipidemia Maternal Grandmother    Diabetes Maternal Grandmother    Breast cancer Maternal Grandmother    Hypertension Maternal Grandfather    Hyperlipidemia Maternal Grandfather    Diabetes Maternal Grandfather     Social History Social History   Tobacco Use   Smoking status: Former    Current packs/day: 0.25    Types: Cigars, Cigarettes   Smokeless tobacco: Never  Vaping Use   Vaping status: Every Day   Substances: Nicotine, Flavoring  Substance Use Topics   Alcohol use: Yes    Comment: socially   Drug use: No     Allergies   Banana  Review of Systems Review of Systems  Per HPI   Physical Exam Triage Vital Signs ED Triage Vitals  Encounter Vitals Group     BP 04/04/23 1545 115/77     Systolic BP Percentile --      Diastolic BP Percentile --      Pulse Rate 04/04/23 1545 64     Resp 04/04/23 1545 16     Temp 04/04/23 1545 98.4 F (36.9 C)     Temp Source 04/04/23 1545 Oral     SpO2 04/04/23 1545 99 %     Weight --      Height --      Head Circumference --      Peak Flow --      Pain Score 04/04/23 1544 10     Pain Loc --      Pain Education --      Exclude from Growth Chart --    No data found.  Updated Vital Signs BP 115/77 (BP Location: Left Arm)   Pulse 64   Temp 98.4 F (36.9 C) (Oral)   Resp 16   LMP 03/29/2023 (Exact Date)   SpO2 99%   Visual Acuity Right Eye Distance:   Left Eye Distance:   Bilateral Distance:    Right Eye Near:   Left Eye Near:    Bilateral Near:     Physical Exam Vitals and  nursing note reviewed.  Constitutional:      Appearance: Normal appearance.  HENT:     Head: Normocephalic and atraumatic.     Right Ear: External ear normal.     Left Ear: External ear normal.     Nose: Nose normal.     Mouth/Throat:     Mouth: Mucous membranes are moist.     Dentition: Abnormal dentition. Dental tenderness and dental caries present.      Comments: Patient does have some missing teeth.  One of her teeth is tender to percussion.  No obvious abscess or drainage. Eyes:     Conjunctiva/sclera: Conjunctivae normal.  Pulmonary:     Effort: Pulmonary effort is normal. No respiratory distress.  Musculoskeletal:        General: Normal range of motion.     Cervical back: Normal range of motion.  Skin:    General: Skin is warm and dry.  Neurological:     General: No focal deficit present.     Mental Status: She is alert.  Psychiatric:        Mood and Affect: Mood normal.        Behavior: Behavior is cooperative.      UC Treatments / Results  Labs (all labs ordered are listed, but only abnormal results are displayed) Labs Reviewed - No data to display  EKG   Radiology No results found.  Procedures Procedures (including critical care time)  Medications Ordered in UC Medications  ketorolac (TORADOL) injection 30 mg (has no administration in time range)    Initial Impression / Assessment and Plan / UC Course  I have reviewed the triage vital signs and the nursing notes.  Pertinent labs & imaging results that were available during my care of the patient were reviewed by me and considered in my medical decision making (see chart for details).  Vitals and triage reviewed, patient is hemodynamically stable.  Concern over early infection causing the dental pain, will cover with Augmentin.  No obvious abscess, airway compromise or purulent drainage.  IM Toradol given in clinic for  pain.  Will provide with dental resources and encouraged dentist follow-up.  Plan of  care, follow-up care and return precautions given, no questions at this time.    Final Clinical Impressions(s) / UC Diagnoses   Final diagnoses:  Pain, dental     Discharge Instructions      We have given you Toradol in clinic to help with pain. For any breakthrough pain you can take 500 mg of Tylenol every 8 hours. Starting tomorrow you can alternate the ibuprofen and tylenol every 4-6 hours.  Start the antibiotics today and take them twice daily until finished, you can take them with food to prevent gastrointestinal upset.  It is important that you follow-up with a dentist, as without treatment of the root of the issue this will likely keep reoccurring.  Return to clinic for any new or urgent symptoms.  [facility] in [place], [place] Address: [address] [place], [place], [place] [address] Phone: [phone]  [facility] [phone] extension [phone] [address]  Dr. [place] Marseilles [phone] [address]  [place] [phone] [facility] [place].  [facility] [phone] extension [phone] [place], [place], [address] First come first serve for the first 10 clients.  May do simple extractions only, no wisdom teeth or surgery.  You may try the second for Thursday of the month starting at 6:30 AM.  [facility] You may call the school to see if they are still helping to provide dental care for emergent cases.     ED Prescriptions     Medication Sig Dispense Auth. Provider   amoxicillin-clavulanate (AUGMENTIN) 875-125 MG tablet Take 1 tablet by mouth every 12 (twelve) hours. 14 tablet Rinaldo Ratel, [place] N, [place]   ibuprofen (ADVIL) 800 MG tablet Take 1 tablet (800 mg total) by mouth 3 (three) times daily. 21 tablet Rinaldo Ratel, [place], [place]   acetaminophen (TYLENOL) 500 MG tablet Take 1 tablet (500 mg total) by mouth every 6 (six) hours as needed. 30 tablet Valda Christenson, [place], [place]      PDMP not  reviewed this encounter.   Cristina Ceniceros, [place] N, [place] 04/04/23 1620

## 2023-04-04 NOTE — ED Triage Notes (Signed)
Pt c/o of dental pain on left side of mouth that started a few days ago

## 2023-07-06 ENCOUNTER — Encounter: Payer: Self-pay | Admitting: Family Medicine

## 2023-07-06 ENCOUNTER — Ambulatory Visit (INDEPENDENT_AMBULATORY_CARE_PROVIDER_SITE_OTHER): Payer: Medicaid Other | Admitting: Family Medicine

## 2023-07-06 VITALS — BP 110/60 | HR 78 | Temp 98.6°F | Ht 63.0 in | Wt 172.6 lb

## 2023-07-06 DIAGNOSIS — R140 Abdominal distension (gaseous): Secondary | ICD-10-CM | POA: Diagnosis not present

## 2023-07-06 DIAGNOSIS — R102 Pelvic and perineal pain: Secondary | ICD-10-CM | POA: Diagnosis not present

## 2023-07-06 DIAGNOSIS — J302 Other seasonal allergic rhinitis: Secondary | ICD-10-CM | POA: Diagnosis not present

## 2023-07-06 DIAGNOSIS — Z975 Presence of (intrauterine) contraceptive device: Secondary | ICD-10-CM | POA: Diagnosis not present

## 2023-07-06 DIAGNOSIS — R6889 Other general symptoms and signs: Secondary | ICD-10-CM

## 2023-07-06 DIAGNOSIS — R200 Anesthesia of skin: Secondary | ICD-10-CM

## 2023-07-06 DIAGNOSIS — R202 Paresthesia of skin: Secondary | ICD-10-CM | POA: Diagnosis not present

## 2023-07-06 DIAGNOSIS — Z136 Encounter for screening for cardiovascular disorders: Secondary | ICD-10-CM

## 2023-07-06 LAB — CBC WITH DIFFERENTIAL/PLATELET
Basophils Absolute: 0 10*3/uL (ref 0.0–0.1)
Basophils Relative: 0.9 % (ref 0.0–3.0)
Eosinophils Absolute: 0.1 10*3/uL (ref 0.0–0.7)
Eosinophils Relative: 2 % (ref 0.0–5.0)
HCT: 39.8 % (ref 36.0–46.0)
Hemoglobin: 13.6 g/dL (ref 12.0–15.0)
Lymphocytes Relative: 56.7 % — ABNORMAL HIGH (ref 12.0–46.0)
Lymphs Abs: 2.5 10*3/uL (ref 0.7–4.0)
MCHC: 34.2 g/dL (ref 30.0–36.0)
MCV: 91.8 fl (ref 78.0–100.0)
Monocytes Absolute: 0.4 10*3/uL (ref 0.1–1.0)
Monocytes Relative: 9.1 % (ref 3.0–12.0)
Neutro Abs: 1.4 10*3/uL (ref 1.4–7.7)
Neutrophils Relative %: 31.3 % — ABNORMAL LOW (ref 43.0–77.0)
Platelets: 326 10*3/uL (ref 150.0–400.0)
RBC: 4.34 Mil/uL (ref 3.87–5.11)
RDW: 12.2 % (ref 11.5–15.5)
WBC: 4.4 10*3/uL (ref 4.0–10.5)

## 2023-07-06 LAB — COMPREHENSIVE METABOLIC PANEL
ALT: 9 U/L (ref 0–35)
AST: 15 U/L (ref 0–37)
Albumin: 4.8 g/dL (ref 3.5–5.2)
Alkaline Phosphatase: 71 U/L (ref 39–117)
BUN: 8 mg/dL (ref 6–23)
CO2: 27 meq/L (ref 19–32)
Calcium: 10 mg/dL (ref 8.4–10.5)
Chloride: 105 meq/L (ref 96–112)
Creatinine, Ser: 0.88 mg/dL (ref 0.40–1.20)
GFR: 87.38 mL/min (ref >60.00)
Glucose, Bld: 97 mg/dL (ref 70–99)
Potassium: 4.1 meq/L (ref 3.5–5.1)
Sodium: 142 meq/L (ref 135–145)
Total Bilirubin: 0.4 mg/dL (ref 0.2–1.2)
Total Protein: 7.9 g/dL (ref 6.0–8.3)

## 2023-07-06 LAB — LIPID PANEL
Cholesterol: 142 mg/dL (ref 0–200)
HDL: 41 mg/dL (ref >39.00)
LDL Cholesterol: 91 mg/dL (ref 0–99)
NonHDL: 101.05
Total CHOL/HDL Ratio: 3
Triglycerides: 52 mg/dL (ref 0.0–149.0)
VLDL: 10.4 mg/dL (ref 0.0–40.0)

## 2023-07-06 LAB — VITAMIN B12: Vitamin B-12: 392 pg/mL (ref 211–911)

## 2023-07-06 NOTE — Progress Notes (Signed)
 New Patient Office Visit  Subjective    Patient ID: Diamond Rodriguez, female    DOB: 03-16-1991  Age: 33 y.o. MRN: [id number]  CC:  Chief Complaint  Patient presents with   Establish Care    HPI [name] presents to establish care today. Has not previously had primary care. Has Nexplanon implant, managed by [facility]. Reports mood swings, joint pain, pelvic pain, breast pain since having this in place.  Has had mammogram in the past for breast pain, has been told this could be related to her nexplanon, normal. Report reviewed by me. She is not fasting today. She does not take any other prescription medications.  Denies other concerns.  Medical hx as outlined below.    Outpatient Encounter Medications as of 07/06/2023  Medication Sig   acetaminophen (TYLENOL) 500 MG tablet Take 1 tablet (500 mg total) by mouth every 6 (six) hours as needed.   etonogestrel (NEXPLANON) 68 MG IMPL implant 1 each by Subdermal route once.   ibuprofen (ADVIL) 800 MG tablet Take 1 tablet (800 mg total) by mouth 3 (three) times daily.   [DISCONTINUED] albuterol (VENTOLIN HFA) 108 (90 BASE) MCG/ACT inhaler Inhale 2 puffs into the lungs every 6 (six) hours as needed. Shortness of breath   [DISCONTINUED] amoxicillin-clavulanate (AUGMENTIN) 875-125 MG tablet Take 1 tablet by mouth every 12 (twelve) hours. (Patient not taking: Reported on 07/06/2023)   [DISCONTINUED] diphenhydrAMINE (BENADRYL) 25 MG tablet Take 1 tablet (25 mg total) by mouth every 8 (eight) hours as needed.   [DISCONTINUED] fluticasone (FLONASE) 50 MCG/ACT nasal spray Place 1 spray into both nostrils daily.   No facility-administered encounter medications on file as of 07/06/2023.    Past Medical History:  Diagnosis Date   Allergy    Asthma    Chlamydia    Depression    UTI (lower urinary tract infection)     Past Surgical History:  Procedure Laterality Date   WISDOM TOOTH EXTRACTION      Family History   Problem Relation Age of Onset   Hypertension Maternal Grandmother    Hyperlipidemia Maternal Grandmother    Diabetes Maternal Grandmother    Breast cancer Maternal Grandmother    Hypertension Maternal Grandfather    Hyperlipidemia Maternal Grandfather    Diabetes Maternal Grandfather    COPD Maternal Grandfather    Miscarriages / [place] Sister     Social History   Socioeconomic History   Marital status: Single    Spouse name: Not on file   Number of children: Not on file   Years of education: Not on file   Highest education level: Some college, no degree  Occupational History   Not on file  Tobacco Use   Smoking status: Some Days    Current packs/day: 0.25    Types: Cigarettes, Cigars   Smokeless tobacco: Never  Vaping Use   Vaping status: Every Day   Substances: Nicotine, Flavoring  Substance and Sexual Activity   Alcohol use: Yes    Comment: socially   Drug use: No   Sexual activity: Yes    Birth control/protection: Implant  Other Topics Concern   Not on file  Social History Narrative   Not on file   Social Drivers of Health   Financial Resource Strain: Medium Risk (07/02/2023)   Overall Financial Resource Strain (CARDIA)    Difficulty of Paying Living Expenses: Somewhat hard  Food Insecurity: Food Insecurity Present (07/02/2023)   Hunger Vital Sign  Worried About Programme researcher, broadcasting/film/video in the Last Year: Sometimes true    Ran Out of Food in the Last Year: Sometimes true  Transportation Needs: Unmet Transportation Needs (07/02/2023)   PRAPARE - Administrator, Civil Service (Medical): Yes    Lack of Transportation (Non-Medical): Yes  Physical Activity: Inactive (07/02/2023)   Exercise Vital Sign    Days of Exercise per Week: 7 days    Minutes of Exercise per Session: 0 min  Stress: No Stress Concern Present (07/02/2023)   [employer] of Occupational Health - Occupational Stress Questionnaire    Feeling of Stress : Only a little  Social  Connections: Socially Isolated (07/02/2023)   Social Connection and Isolation Panel [NHANES]    Frequency of Communication with Friends and Family: Once a week    Frequency of Social Gatherings with Friends and Family: Once a week    Attends Religious Services: 1 to 4 times per year    Active Member of [employer] or Organizations: No    Attends Engineer, structural: Not on file    Marital Status: Never married  Catering manager Violence: Not on file    ROS Per HPI      Objective    BP 110/60 (BP Location: Left Arm, Patient Position: Sitting)   Pulse 78   Temp 98.6 F (37 C) (Temporal)   Ht 5\' 3"  (1.6 m)   Wt 172 lb 9.6 oz (78.3 kg)   SpO2 99%   BMI 30.57 kg/m   Physical Exam Vitals and nursing note reviewed.  Constitutional:      Appearance: Normal appearance. She is normal weight.  HENT:     Head: Normocephalic and atraumatic.     Right Ear: Tympanic membrane and ear canal normal.     Left Ear: Tympanic membrane and ear canal normal.     Nose: Nose normal.  Eyes:     Extraocular Movements: Extraocular movements intact.     Pupils: Pupils are equal, round, and reactive to light.  Cardiovascular:     Rate and Rhythm: Normal rate and regular rhythm.     Heart sounds: Normal heart sounds.  Pulmonary:     Effort: Pulmonary effort is normal.     Breath sounds: Normal breath sounds.  Musculoskeletal:        General: Normal range of motion.     Cervical back: Normal range of motion.  Neurological:     General: No focal deficit present.     Mental Status: She is alert and oriented to person, place, and time.  Psychiatric:        Mood and Affect: Mood normal.        Thought Content: Thought content normal.         Assessment & Plan:   Seasonal allergies -     Comprehensive metabolic panel -     CBC with Differential/Platelet  Nexplanon in place -     Comprehensive metabolic panel -     CBC with Differential/Platelet  Bloating -     Comprehensive  metabolic panel -     CBC with Differential/Platelet -     Thyroid Panel With TSH  Pelvic pain -     Comprehensive metabolic panel -     CBC with Differential/Platelet -     hCG, serum, qualitative; Future -     Thyroid Panel With TSH  Fluctuation of weight -     hCG, serum, qualitative; Future -  Thyroid Panel With TSH  Encounter for screening for cardiovascular disorders -     Lipid panel  Numbness and tingling of both feet -     Vitamin B12     Return for as needed.   Moshe Cipro, FNP

## 2023-07-06 NOTE — Patient Instructions (Addendum)
 Welcome to [facility]!  We are checking labs today, will be in contact with any results that require further attention  I'd like you to follow up with GYN for removal of Nexplanon.  Follow-up with me for new or worsening symptoms.

## 2023-07-07 LAB — THYROID PANEL WITH TSH
Free Thyroxine Index: 2.7 (ref 1.4–3.8)
T3 Uptake: 29 % (ref 22–35)
T4, Total: 9.3 ug/dL (ref 5.1–11.9)
TSH: 0.7 m[IU]/L

## 2023-07-07 LAB — HCG, SERUM, QUALITATIVE: Preg, Serum: NEGATIVE

## 2024-03-17 ENCOUNTER — Telehealth: Payer: Self-pay

## 2024-03-17 ENCOUNTER — Ambulatory Visit (INDEPENDENT_AMBULATORY_CARE_PROVIDER_SITE_OTHER): Admitting: Family Medicine

## 2024-03-17 VITALS — BP 116/76 | HR 70 | Temp 98.6°F | Ht 63.0 in | Wt 179.0 lb

## 2024-03-17 DIAGNOSIS — Z7689 Persons encountering health services in other specified circumstances: Secondary | ICD-10-CM

## 2024-03-17 DIAGNOSIS — Z6831 Body mass index (BMI) 31.0-31.9, adult: Secondary | ICD-10-CM | POA: Diagnosis not present

## 2024-03-17 DIAGNOSIS — R202 Paresthesia of skin: Secondary | ICD-10-CM

## 2024-03-17 DIAGNOSIS — R200 Anesthesia of skin: Secondary | ICD-10-CM

## 2024-03-17 DIAGNOSIS — E6609 Other obesity due to excess calories: Secondary | ICD-10-CM

## 2024-03-17 DIAGNOSIS — M25532 Pain in left wrist: Secondary | ICD-10-CM | POA: Diagnosis not present

## 2024-03-17 DIAGNOSIS — E66811 Obesity, class 1: Secondary | ICD-10-CM

## 2024-03-17 LAB — CBC WITH DIFFERENTIAL/PLATELET
Basophils Absolute: 0 K/uL (ref 0.0–0.1)
Basophils Relative: 0.6 % (ref 0.0–3.0)
Eosinophils Absolute: 0.1 K/uL (ref 0.0–0.7)
Eosinophils Relative: 1.9 % (ref 0.0–5.0)
HCT: 39.4 % (ref 36.0–46.0)
Hemoglobin: 13.6 g/dL (ref 12.0–15.0)
Lymphocytes Relative: 61.2 % — ABNORMAL HIGH (ref 12.0–46.0)
Lymphs Abs: 3.2 K/uL (ref 0.7–4.0)
MCHC: 34.4 g/dL (ref 30.0–36.0)
MCV: 90.8 fl (ref 78.0–100.0)
Monocytes Absolute: 0.5 K/uL (ref 0.1–1.0)
Monocytes Relative: 9.2 % (ref 3.0–12.0)
Neutro Abs: 1.4 K/uL (ref 1.4–7.7)
Neutrophils Relative %: 27.1 % — ABNORMAL LOW (ref 43.0–77.0)
Platelets: 330 K/uL (ref 150.0–400.0)
RBC: 4.33 Mil/uL (ref 3.87–5.11)
RDW: 12.6 % (ref 11.5–15.5)
WBC: 5.3 K/uL (ref 4.0–10.5)

## 2024-03-17 LAB — COMPREHENSIVE METABOLIC PANEL WITH GFR
ALT: 10 U/L (ref 0–35)
AST: 15 U/L (ref 0–37)
Albumin: 4.6 g/dL (ref 3.5–5.2)
Alkaline Phosphatase: 68 U/L (ref 39–117)
BUN: 8 mg/dL (ref 6–23)
CO2: 27 meq/L (ref 19–32)
Calcium: 9.5 mg/dL (ref 8.4–10.5)
Chloride: 103 meq/L (ref 96–112)
Creatinine, Ser: 0.79 mg/dL (ref 0.40–1.20)
GFR: 98.98 mL/min (ref >60.00)
Glucose, Bld: 89 mg/dL (ref 70–99)
Potassium: 3.7 meq/L (ref 3.5–5.1)
Sodium: 135 meq/L (ref 135–145)
Total Bilirubin: 0.4 mg/dL (ref 0.2–1.2)
Total Protein: 8 g/dL (ref 6.0–8.3)

## 2024-03-17 LAB — HEMOGLOBIN A1C: Hgb A1c MFr Bld: 5.1 % (ref 4.6–6.5)

## 2024-03-17 LAB — TSH: TSH: 0.93 u[IU]/mL (ref 0.35–5.50)

## 2024-03-17 LAB — VITAMIN D 25 HYDROXY (VIT D DEFICIENCY, FRACTURES): VITD: 15.93 ng/mL — ABNORMAL LOW (ref 30.00–100.00)

## 2024-03-17 LAB — VITAMIN B12: Vitamin B-12: 350 pg/mL (ref 211–911)

## 2024-03-17 NOTE — Progress Notes (Signed)
 New Patient Office Visit  Subjective   Patient ID: LASHONDRA VAQUERANO, female    DOB: Feb 16, 1991  Age: 33 y.o. MRN: [id number]  CC:  Chief Complaint  Patient presents with   Establish Care    HPI FREEDOM LOPEZPEREZ presents to establish care with new provider.  Patients previous PCP: [facility] with Corean Ku, FNP.   Specialist: [facility] with Dr. Winton Felt.   Patient is complaining of numbness in her finger tips. On the left hand, her numbness and tingling starts at her wrist and then on the right side, it is just the finger tips. Both hands usually be occurring at the same time. Started right after having Nexplanon  changed from left upper arm in a different location, medial side to posterior side last year. Numbness is random, lasting about 2 minutes. Occurs more when sleeping. Not daily. Denies any pain her wrist or hands. Sometimes she will be having cervical pain. Cervical pain only occurs when stressed out, random. She reports she was told it could be related to the Nexplanon , but GYN reassured her that her Nexplanon  was in place.  Outpatient Encounter Medications as of 03/17/2024  Medication Sig   etonogestrel  (NEXPLANON ) 68 MG IMPL implant 1 each by Subdermal route once.   ibuprofen  (ADVIL ) 800 MG tablet Take 1 tablet (800 mg total) by mouth 3 (three) times daily.   [DISCONTINUED] acetaminophen  (TYLENOL ) 500 MG tablet Take 1 tablet (500 mg total) by mouth every 6 (six) hours as needed.   [DISCONTINUED] albuterol  (VENTOLIN  HFA) 108 (90 BASE) MCG/ACT inhaler Inhale 2 puffs into the lungs every 6 (six) hours as needed. Shortness of breath   [DISCONTINUED] diphenhydrAMINE  (BENADRYL ) 25 MG tablet Take 1 tablet (25 mg total) by mouth every 8 (eight) hours as needed.   [DISCONTINUED] fluticasone  (FLONASE ) 50 MCG/ACT nasal spray Place 1 spray into both nostrils daily.   No facility-administered  encounter medications on file as of 03/17/2024.    Past Medical History:  Diagnosis Date   Allergy    Asthma    Chlamydia    Depression    UTI (lower urinary tract infection)     Past Surgical History:  Procedure Laterality Date   WISDOM TOOTH EXTRACTION      Family History  Problem Relation Age of Onset   Miscarriages / Stillbirths Sister    Breast cancer Paternal Aunt    Hypertension Maternal Grandmother    Hyperlipidemia Maternal Grandmother    Diabetes Maternal Grandmother    Breast cancer Maternal Grandmother    Hypertension Maternal Grandfather    Hyperlipidemia Maternal Grandfather    Diabetes Maternal Grandfather    COPD Maternal Grandfather    Hypertension Paternal Grandmother    Hypertension Paternal Grandfather     Social History   Socioeconomic History   Marital status: Single    Spouse name: Not on file   Number of children: 1   Years of education: Not on file   Highest education level: Some college, no degree  Occupational History   Not on file  Tobacco Use   Smoking status: Some Days    Current packs/day: 0.25    Types: Cigarettes, Cigars   Smokeless tobacco: Never  Vaping Use   Vaping status: Every Day   Substances: Nicotine, Flavoring  Substance and Sexual Activity   Alcohol use: Yes    Comment: Once every blue moon   Drug use: No  Sexual activity: Yes    Birth control/protection: Implant  Other Topics Concern   Not on file  Social History Narrative   Not on file   Social Drivers of Health   Financial Resource Strain: Medium Risk (03/17/2024)   Overall Financial Resource Strain (CARDIA)    Difficulty of Paying Living Expenses: Somewhat hard  Food Insecurity: Food Insecurity Present (03/17/2024)   Hunger Vital Sign    Worried About Running Out of Food in the Last Year: Sometimes true    Ran Out of Food in the Last Year: Sometimes true  Transportation Needs: Unmet Transportation Needs (03/17/2024)   PRAPARE - Transportation     Lack of Transportation (Medical): No    Lack of Transportation (Non-Medical): Yes  Physical Activity: Insufficiently Active (03/17/2024)   Exercise Vital Sign    Days of Exercise per Week: 5 days    Minutes of Exercise per Session: 20 min  Stress: No Stress Concern Present (03/17/2024)   [employer] of Occupational Health - Occupational Stress Questionnaire    Feeling of Stress: Only a little  Social Connections: Moderately Isolated (03/17/2024)   Social Connection and Isolation Panel    Frequency of Communication with Friends and Family: More than three times a week    Frequency of Social Gatherings with Friends and Family: Once a week    Attends Religious Services: 1 to 4 times per year    Active Member of [employer] or Organizations: No    Attends Engineer, Structural: Not on file    Marital Status: Never married  Intimate Partner Violence: Not At Risk (03/17/2024)   Humiliation, Afraid, Rape, and Kick questionnaire    Fear of Current or Ex-Partner: No    Emotionally Abused: No    Physically Abused: No    Sexually Abused: No    ROS See HPI above    Objective  BP 116/76   Pulse 70   Temp 98.6 F (37 C) (Oral)   Ht 5' 3 (1.6 m)   Wt 179 lb (81.2 kg)   SpO2 99%   BMI 31.71 kg/m   Physical Exam Vitals reviewed.  Constitutional:      General: She is not in acute distress.    Appearance: Normal appearance. She is obese. She is not ill-appearing, toxic-appearing or diaphoretic.  HENT:     Head: Normocephalic and atraumatic.  Eyes:     General:        Right eye: No discharge.        Left eye: No discharge.     Conjunctiva/sclera: Conjunctivae normal.  Cardiovascular:     Rate and Rhythm: Normal rate and regular rhythm.     Heart sounds: Normal heart sounds. No murmur heard.    No friction rub. No gallop.  Pulmonary:     Effort: Pulmonary effort is normal. No respiratory distress.     Breath sounds: Normal breath sounds.  Musculoskeletal:         General: Normal range of motion.     Right wrist: No swelling or deformity. Normal range of motion. Normal pulse.     Left wrist: Tenderness present. No swelling or deformity. Normal range of motion. Normal pulse.  Skin:    General: Skin is warm and dry.  Neurological:     General: No focal deficit present.     Mental Status: She is alert and oriented to person, place, and time. Mental status is at baseline.  Psychiatric:  Mood and Affect: Mood normal.        Behavior: Behavior normal.        Thought Content: Thought content normal.        Judgment: Judgment normal.      Assessment & Plan:  Numbness and tingling of both upper extremities -     CBC with Differential/Platelet -     Comprehensive metabolic panel with GFR -     Hemoglobin A1c -     TSH -     Vitamin B12 -     Ambulatory referral to Orthopedic Surgery  Left wrist pain -     Ambulatory referral to Orthopedic Surgery  Class 1 obesity due to excess calories without serious comorbidity with body mass index (BMI) of 31.0 to 31.9 in adult -     CBC with Differential/Platelet -     Comprehensive metabolic panel with GFR -     Hemoglobin A1c -     TSH -     Vitamin B12 -     VITAMIN D 25 Hydroxy (Vit-D Deficiency, Fractures)  Encounter to establish care  1.Review health maintenance:  -Covid booster: Declines  -Hep B vaccine: May upload immunization records to MyChart  -HPV vaccine: Unknown, May upload immunization records to MyChart  -Influenza vaccine: Declines  -PNA vaccine: Not had  2.Ordered labs based on BMI-obesity, and numbness/tingling. Office will call with lab results.  3.Placed a referral to orthopedic for left wrist tenderness/pain and numbness and tingling in hands and fingertips. Suspect this is related to carpel tunnel with description of numbness/tingling, tenderness when applying pressure, and occurring more at night time. May take Tylenol  or Ibuprofen  for pain. Also, can wear thumb spica  splints at night to help with the pain. Offered a Prednisone  taper dose pak and a referral to physical therapy, but declined. She rather be seen by orthopedic. Return in about 1 year (around 03/17/2025) for physical.   Baily Hovanec, NP

## 2024-03-17 NOTE — Patient Instructions (Addendum)
-  It was nice to meet you and look forward to taking care of you.  -Please upload your immunization records on to MyChart.  -Ordered labs based on BMI-obesity, and numbness/tingling. Office will call with lab results.  -Placed a referral to orthopedic for left wrist tenderness/pain and numbness and tingling in hands and fingertips. Suspect this is related to carpel tunnel with description of numbness/tingling, tenderness when applying pressure, and occurring more at night time. May take Tylenol  or Ibuprofen  for pain. Also, can wear thumb spica splints at night to help with the pain. Please call the office or send a MyChart message if you do not receive a phone call or a MyChart message about appointment in 2 weeks.  -Follow up in 1 year for a physical.

## 2024-03-17 NOTE — Telephone Encounter (Signed)
 Copied from CRM [id number]. Topic: General - Other >> Mar 17, 2024  8:30 AM Charlet HERO wrote: Reason for CRM: Patient is wanting to know if she can have the paperwork that she needs to sign for [facility] emailed to her? She would like a call back asap she  has appt for today at 10

## 2024-03-18 ENCOUNTER — Ambulatory Visit: Payer: Self-pay | Admitting: Family Medicine

## 2024-03-18 DIAGNOSIS — E559 Vitamin D deficiency, unspecified: Secondary | ICD-10-CM

## 2024-03-18 DIAGNOSIS — R7989 Other specified abnormal findings of blood chemistry: Secondary | ICD-10-CM

## 2024-03-18 MED ORDER — VITAMIN D3 1.25 MG (50000 UT) PO CAPS: 1.2500 mg | ORAL_CAPSULE | ORAL | 0 refills | Status: AC

## 2024-04-05 ENCOUNTER — Ambulatory Visit: Admitting: Orthopedic Surgery

## 2024-04-07 ENCOUNTER — Emergency Department (HOSPITAL_BASED_OUTPATIENT_CLINIC_OR_DEPARTMENT_OTHER)
Admission: EM | Admit: 2024-04-07 | Discharge: 2024-04-07 | Disposition: A | Discharge status: Home / Self Care | Attending: Emergency Medicine | Admitting: Emergency Medicine

## 2024-04-07 ENCOUNTER — Other Ambulatory Visit: Payer: Self-pay

## 2024-04-07 DIAGNOSIS — R059 Cough, unspecified: Secondary | ICD-10-CM | POA: Diagnosis present

## 2024-04-07 DIAGNOSIS — J101 Influenza due to other identified influenza virus with other respiratory manifestations: Secondary | ICD-10-CM | POA: Insufficient documentation

## 2024-04-07 LAB — RESP PANEL BY RT-PCR (RSV, FLU A&B, COVID)  RVPGX2
Influenza A by PCR: POSITIVE — AB
Influenza B by PCR: NEGATIVE
Resp Syncytial Virus by PCR: NEGATIVE
SARS Coronavirus 2 by RT PCR: NEGATIVE

## 2024-04-07 MED ORDER — ACETAMINOPHEN 325 MG PO TABS
650.0000 mg | ORAL_TABLET | Freq: Once | ORAL | Status: AC | PRN
  Administered 2024-04-07: 650 mg via ORAL
  Filled 2024-04-07: qty 2

## 2024-04-07 NOTE — Discharge Instructions (Addendum)
 Tylenol  every 4 hours. Drink plenty of fluids. See your Physician for recheck.

## 2024-04-07 NOTE — ED Provider Notes (Signed)
 [place] EMERGENCY DEPARTMENT AT [facility] Provider Note   CSN: [id number] Arrival date & time: 04/07/24  8147     Patient presents with: URI   Diamond Rodriguez is a 33 y.o. female.   Pt complains of fever, chills and body aches.  Pt concerned that she has covid.  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  Pt denies any shortness of breath.    The history is provided by the patient. No language interpreter was used.  URI Presenting symptoms: congestion, cough and fever   Severity:  Moderate Onset quality:  Gradual Timing:  Constant Progression:  Worsening Chronicity:  New Relieved by:  Nothing Worsened by:  Nothing Ineffective treatments:  None tried Associated symptoms: headaches and myalgias   Risk factors: no diabetes mellitus        Prior to Admission medications  Medication Sig Start Date End Date Taking? Authorizing Provider  Cholecalciferol (VITAMIN D3) 1.25 MG (50000 UT) CAPS Take 1 capsule (1.25 mg total) by mouth every 7 (seven) days. 03/18/24   Billy Philippe JONELLE, NP  etonogestrel  (NEXPLANON ) 68 MG IMPL implant 1 each by Subdermal route once.    [provider]  ibuprofen  (ADVIL ) 800 MG tablet Take 1 tablet (800 mg total) by mouth 3 (three) times daily. 04/04/23   Dreama, [place]  N, FNP  albuterol  (VENTOLIN  HFA) 108 (90 BASE) MCG/ACT inhaler Inhale 2 puffs into the lungs every 6 (six) hours as needed. Shortness of breath 04/13/11 07/07/11  Rogelio Planas, MD  diphenhydrAMINE  (BENADRYL ) 25 MG tablet Take 1 tablet (25 mg total) by mouth every 8 (eight) hours as needed. 07/19/17 11/03/18  Vicky Charleston, PA-C  fluticasone  (FLONASE ) 50 MCG/ACT nasal spray Place 1 spray into both nostrils daily. 11/03/18 06/20/19  Hall-Potvin, Brittany, PA-C    Allergies: Banana    Review of Systems  Constitutional:  Positive for fever.  HENT:  Positive for congestion.   Respiratory:  Positive for cough.   Musculoskeletal:   Positive for myalgias.  Neurological:  Positive for headaches.  All other systems reviewed and are negative.   Updated Vital Signs BP 113/80 (BP Location: Right Arm)   Pulse 98   Temp 99.4 F (37.4 C) (Oral)   Resp 18   Ht 5' 3 (1.6 m)   Wt 78 kg   LMP 04/06/2024 (Approximate)   SpO2 98%   BMI 30.47 kg/m   Physical Exam Vitals and nursing note reviewed.  Constitutional:      Appearance: She is well-developed.  HENT:     Head: Normocephalic.  Cardiovascular:     Rate and Rhythm: Normal rate.  Pulmonary:     Effort: Pulmonary effort is normal.  Abdominal:     General: There is no distension.  Musculoskeletal:        General: Normal range of motion.     Cervical back: Normal range of motion.  Skin:    General: Skin is warm.  Neurological:     General: No focal deficit present.     Mental Status: She is alert and oriented to person, place, and time.     (all labs ordered are listed, but only abnormal results are displayed) Labs Reviewed  RESP PANEL BY RT-PCR (RSV, FLU A&B, COVID)  [id number] - Abnormal; Notable for the following components:      Result Value   Influenza A by PCR POSITIVE (*)    All other components within normal limits    EKG:  None  Radiology: No results found.   Procedures   Medications Ordered in the ED  acetaminophen  (TYLENOL ) tablet 650 mg (650 mg Oral Given 04/07/24 1918)                                    Medical Decision Making Patient complains of fever chills and bodyaches.  Patient is concerned that she may have COVID.  She has been around lots of people who have been sick  Amount and/or Complexity of Data Reviewed Labs: ordered. Decision-making details documented in ED Course.    Details: Labs ordered reviewed and interpreted influenza A is positive  Risk OTC drugs. Risk Details: Patient is given Tylenol  for fever.  Patient is advised Tylenol  every 4 hours.  She is counseled on the results of her testing.  Patient is given  a note to be out of work for the next 5 days she is to return to the emergency department for any problems.        Final diagnoses:  Influenza A    ED Discharge Orders     None      An After Visit Summary was printed and given to the patient.     Flint Sonny POUR, PA-C 04/07/24 2135    Ruthe Cornet, DO 04/07/24 2221

## 2024-04-07 NOTE — ED Triage Notes (Addendum)
 Pt POV reporting headache, congestion, and SOB x3 days, concerned for covid. Last cold medicine taken at 0600.

## 2024-04-14 ENCOUNTER — Other Ambulatory Visit: Payer: Self-pay

## 2024-04-14 ENCOUNTER — Encounter (HOSPITAL_BASED_OUTPATIENT_CLINIC_OR_DEPARTMENT_OTHER): Payer: Self-pay | Admitting: *Deleted

## 2024-04-14 ENCOUNTER — Emergency Department (HOSPITAL_BASED_OUTPATIENT_CLINIC_OR_DEPARTMENT_OTHER)
Admission: EM | Admit: 2024-04-14 | Discharge: 2024-04-14 | Disposition: A | Discharge status: Home / Self Care | Attending: Emergency Medicine | Admitting: Emergency Medicine

## 2024-04-14 DIAGNOSIS — J020 Streptococcal pharyngitis: Secondary | ICD-10-CM | POA: Insufficient documentation

## 2024-04-14 LAB — RESP PANEL BY RT-PCR (RSV, FLU A&B, COVID)  RVPGX2
Influenza A by PCR: NEGATIVE
Influenza B by PCR: NEGATIVE
Resp Syncytial Virus by PCR: NEGATIVE
SARS Coronavirus 2 by RT PCR: NEGATIVE

## 2024-04-14 LAB — GROUP A STREP BY PCR: Group A Strep by PCR: DETECTED — AB

## 2024-04-14 MED ORDER — AZITHROMYCIN 500 MG PO TABS: 500.0000 mg | ORAL_TABLET | Freq: Every day | ORAL | 0 refills | Status: AC

## 2024-04-14 MED ORDER — DEXAMETHASONE 10 MG/ML FOR PEDIATRIC ORAL USE
10.0000 mg | Freq: Once | INTRAMUSCULAR | Status: AC
  Administered 2024-04-14: 22:00:00 10 mg via ORAL

## 2024-04-14 MED ORDER — AMOXICILLIN 500 MG PO CAPS: 500.0000 mg | ORAL_CAPSULE | Freq: Once | ORAL | Status: DC

## 2024-04-14 MED ORDER — AZITHROMYCIN 250 MG PO TABS
500.0000 mg | ORAL_TABLET | Freq: Once | ORAL | Status: AC
  Administered 2024-04-14: 22:00:00 500 mg via ORAL
  Filled 2024-04-14: qty 2

## 2024-04-14 NOTE — ED Triage Notes (Signed)
 Pt has been having a sore throat for 2 days.  Her family member has had strep and flu.  Pt is here with her daughter who has similar symptoms.

## 2024-04-14 NOTE — ED Provider Notes (Addendum)
 " [place] [facility]MENT AT [facility] Provider Note   CSN: [id number] Arrival date & time: 04/14/24  2013     Patient presents with: Sore Throat   Diamond Rodriguez is a 33 y.o. female.  Past medical history significant for asthma presents today for sore throat x 2 days.  Patient reports her family ember has strep and flu.  Patient's daughter is with her and reports similar symptoms.    Sore Throat       Prior to Admission medications  Medication Sig Start Date End Date Taking? Authorizing Provider  azithromycin  (ZITHROMAX ) 500 MG tablet Take 1 tablet (500 mg total) by mouth daily for 4 days. Take 1 every day until finished. 04/14/24 04/18/24 Yes Sugar Vanzandt N, PA-C  Cholecalciferol (VITAMIN D3) 1.25 MG (50000 UT) CAPS Take 1 capsule (1.25 mg total) by mouth every 7 (seven) days. 03/18/24   Billy Philippe JONELLE, NP  etonogestrel  (NEXPLANON ) 68 MG IMPL implant 1 each by Subdermal route once.    [provider]  ibuprofen  (ADVIL ) 800 MG tablet Take 1 tablet (800 mg total) by mouth 3 (three) times daily. 04/04/23   Dreama, [place]  N, FNP  albuterol  (VENTOLIN  HFA) 108 (90 BASE) MCG/ACT inhaler Inhale 2 puffs into the lungs every 6 (six) hours as needed. Shortness of breath 04/13/11 07/07/11  Rogelio Planas, MD  diphenhydrAMINE  (BENADRYL ) 25 MG tablet Take 1 tablet (25 mg total) by mouth every 8 (eight) hours as needed. 07/19/17 11/03/18  Vicky Charleston, PA-C  fluticasone  (FLONASE ) 50 MCG/ACT nasal spray Place 1 spray into both nostrils daily. 11/03/18 06/20/19  Hall-Potvin, Brittany, PA-C    Allergies: Banana    Review of Systems  HENT:  Positive for sore throat.     Updated Vital Signs BP 121/80 (BP Location: Right Arm)   Pulse 76   Temp 99 F (37.2 C) (Oral)   Resp 17   LMP 04/06/2024 (Approximate)   SpO2 100%   Physical Exam Vitals and nursing note reviewed.  Constitutional:      General: She is not in acute distress.    Appearance: She is  well-developed. She is not toxic-appearing.  HENT:     Head: Normocephalic and atraumatic.     Nose: Congestion and rhinorrhea present.     Mouth/Throat:     Mouth: Mucous membranes are moist.     Pharynx: Uvula midline. Posterior oropharyngeal erythema present.  Eyes:     Conjunctiva/sclera: Conjunctivae normal.  Cardiovascular:     Rate and Rhythm: Normal rate and regular rhythm.     Heart sounds: Normal heart sounds. No murmur heard. Pulmonary:     Effort: Pulmonary effort is normal. No respiratory distress.     Breath sounds: Normal breath sounds.  Abdominal:     Palpations: Abdomen is soft.     Tenderness: There is no abdominal tenderness.  Musculoskeletal:        General: No swelling.     Cervical back: Neck supple.  Skin:    General: Skin is warm and dry.     Capillary Refill: Capillary refill takes less than 2 seconds.  Neurological:     Mental Status: She is alert.  Psychiatric:        Mood and Affect: Mood normal.     (all labs ordered are listed, but only abnormal results are displayed) Labs Reviewed  GROUP A STREP BY PCR - Abnormal; Notable for the following components:      Result Value   Group  A Strep by PCR DETECTED (*)    All other components within normal limits  RESP PANEL BY RT-PCR (RSV, FLU A&B, COVID)  [id number]    EKG: None  Radiology: No results found.   Procedures   Medications Ordered in the ED  azithromycin  (ZITHROMAX ) tablet 500 mg (has no administration in time range)                                    Medical Decision Making Risk Prescription drug management.   This patient presents to the ED for concern of sore throat and URI symptoms differential diagnosis includes COVID, flu, RSV, viral URI, strep pharyngitis, viral pharyngitis   Lab Tests:  I Ordered, and personally interpreted labs.  The pertinent results include: Strep PCR positive, negative respiratory panel   Medicines ordered and prescription drug management:  I  ordered medication including azithromycin  and Solu-Medrol  I have reviewed the patients home medicines and have made adjustments as needed   Problem List / ED Course:  Consider for admission or further workup however patient's vital signs, physical exam, and labs are reassuring.  Patient symptoms likely due to strep pharyngitis.  Patient given outpatient course with azithromycin .  Patient given return precautions.  I feel patient is safe for discharge at this time.      Final diagnoses:  Strep pharyngitis    ED Discharge Orders          Ordered    azithromycin  (ZITHROMAX ) 500 MG tablet  Daily        04/14/24 2142               Francis Ileana SAILOR, PA-C 04/14/24 2143    Francis Ileana SAILOR, PA-C 04/15/24 1937    Pamella Ozell LABOR, DO 04/21/24 1304  "

## 2024-04-14 NOTE — Discharge Instructions (Signed)
 Today you were seen for strep pharyngitis.  Please pick up your medication and take as prescribed.  Thank you for letting us  treat you today. After reviewing your labs, I feel you are safe to go home. Please follow up with your PCP in the next several days and provide them with your records from this visit. Return to the Emergency Room if pain becomes severe or symptoms worsen.

## 2024-04-20 ENCOUNTER — Ambulatory Visit (INDEPENDENT_AMBULATORY_CARE_PROVIDER_SITE_OTHER): Admitting: Family Medicine

## 2024-04-20 ENCOUNTER — Encounter: Payer: Self-pay | Admitting: Family Medicine

## 2024-04-20 VITALS — BP 120/80 | HR 79 | Temp 98.1°F | Ht 63.0 in | Wt 179.0 lb

## 2024-04-20 DIAGNOSIS — E559 Vitamin D deficiency, unspecified: Secondary | ICD-10-CM | POA: Diagnosis not present

## 2024-04-20 DIAGNOSIS — Z09 Encounter for follow-up examination after completed treatment for conditions other than malignant neoplasm: Secondary | ICD-10-CM | POA: Diagnosis not present

## 2024-04-20 NOTE — Progress Notes (Signed)
" ° °  Established Patient Office Visit   Subjective:  Patient ID: Diamond Rodriguez, female    DOB: 1990-06-14  Age: 33 y.o. MRN: [id number]  Chief Complaint  Patient presents with   Hospitalization Follow-up    HPI Patient was seen at [facility] ED at [facility] on 04/14/2024 for sore throat and diagnosed with strep pharyngitis. She was prescribed Azithromycin  500mg  daily for 4 days. Prior she diagnosed with influenza A on 04/07/2024 at [facility] ED at [facility].   Overall, she is feeling better. Still has some pain intermittent pain underneath left breast.  ROS See HPI above     Objective:   BP 120/80   Pulse 79   Temp 98.1 F (36.7 C) (Oral)   Ht 5' 3 (1.6 m)   Wt 179 lb (81.2 kg)   LMP 04/06/2024 (Approximate)   SpO2 97%   BMI 31.71 kg/m    Physical Exam Vitals reviewed.  Constitutional:      General: She is not in acute distress.    Appearance: Normal appearance. She is not ill-appearing, toxic-appearing or diaphoretic.  HENT:     Head: Normocephalic and atraumatic.  Eyes:     General:        Right eye: No discharge.        Left eye: No discharge.     Conjunctiva/sclera: Conjunctivae normal.  Cardiovascular:     Rate and Rhythm: Normal rate and regular rhythm.     Heart sounds: Normal heart sounds. No murmur heard.    No friction rub. No gallop.  Pulmonary:     Effort: Pulmonary effort is normal. No respiratory distress.     Breath sounds: Normal breath sounds.  Musculoskeletal:        General: Normal range of motion.  Skin:    General: Skin is warm and dry.  Neurological:     General: No focal deficit present.     Mental Status: She is alert and oriented to person, place, and time. Mental status is at baseline.  Psychiatric:        Mood and Affect: Mood normal.        Behavior: Behavior normal.        Thought Content: Thought content normal.        Judgment: Judgment normal.      Assessment & Plan:  Hospital discharge  follow-up  Vitamin D  deficiency -     VITAMIN D  25 Hydroxy (Vit-D Deficiency, Fractures); Future  -Recommend to continue taking vitamin D  for deficiency. Schedule a lab appointment in 2 months to have your vitamin D  checked.  -Recommend to take over the counter vitamin C 1000mg  daily and zinc 50mg  daily for at least 30 days to support immune system. -Pain is mostly likely from having viral symptoms with coughing previously, follow up if not improved. Lungs are clear.   Return for 2 month lab appointment-vitmin D .   Randale Carvalho, NP "

## 2024-04-20 NOTE — Patient Instructions (Signed)
-  Recommend to continue taking vitamin D  for deficiency. Schedule a lab appointment in 2 months to have your vitamin D  checked.  -Recommend to take over the counter vitamin C 1000mg  daily and zinc 50mg  daily for at least 30 days to support immune system. -Pain is mostly likely from having viral symptoms with coughing previously, follow up if not improved. Lungs are clear.

## 2024-06-21 ENCOUNTER — Other Ambulatory Visit
